# Patient Record
Sex: Female | Born: 1965 | Race: White | Hispanic: No | Marital: Married | Smoking: Never smoker
Health system: Southern US, Community
[De-identification: ages and names within clinical notes are randomized; demographics above are authoritative.]

## PROBLEM LIST (undated history)

## (undated) DIAGNOSIS — R001 Bradycardia, unspecified: Secondary | ICD-10-CM

## (undated) DIAGNOSIS — Z9889 Other specified postprocedural states: Secondary | ICD-10-CM

## (undated) DIAGNOSIS — T8859XA Other complications of anesthesia, initial encounter: Secondary | ICD-10-CM

## (undated) DIAGNOSIS — C801 Malignant (primary) neoplasm, unspecified: Secondary | ICD-10-CM

## (undated) DIAGNOSIS — R079 Chest pain, unspecified: Secondary | ICD-10-CM

## (undated) DIAGNOSIS — K219 Gastro-esophageal reflux disease without esophagitis: Secondary | ICD-10-CM

## (undated) DIAGNOSIS — D649 Anemia, unspecified: Secondary | ICD-10-CM

## (undated) DIAGNOSIS — R112 Nausea with vomiting, unspecified: Secondary | ICD-10-CM

## (undated) HISTORY — PX: EYE SURGERY: SHX253

## (undated) HISTORY — DX: Gastro-esophageal reflux disease without esophagitis: K21.9

## (undated) HISTORY — PX: ABDOMINAL HYSTERECTOMY: SHX81

## (undated) HISTORY — PX: HERNIA REPAIR: SHX51

## (undated) HISTORY — PX: LAPAROSCOPIC GASTRIC SLEEVE RESECTION WITH HIATAL HERNIA REPAIR: SHX6512

---
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
## 2017-07-03 ENCOUNTER — Emergency Department (HOSPITAL_BASED_OUTPATIENT_CLINIC_OR_DEPARTMENT_OTHER)
Admission: EM | Admit: 2017-07-03 | Discharge: 2017-07-03 | Disposition: A | Discharge status: Home / Self Care | Payer: Self-pay | Attending: Emergency Medicine | Admitting: Emergency Medicine

## 2017-07-03 ENCOUNTER — Emergency Department (HOSPITAL_BASED_OUTPATIENT_CLINIC_OR_DEPARTMENT_OTHER): Payer: Self-pay

## 2017-07-03 ENCOUNTER — Encounter (HOSPITAL_BASED_OUTPATIENT_CLINIC_OR_DEPARTMENT_OTHER): Payer: Self-pay | Admitting: Emergency Medicine

## 2017-07-03 DIAGNOSIS — S40012A Contusion of left shoulder, initial encounter: Secondary | ICD-10-CM | POA: Insufficient documentation

## 2017-07-03 DIAGNOSIS — Z8541 Personal history of malignant neoplasm of cervix uteri: Secondary | ICD-10-CM | POA: Insufficient documentation

## 2017-07-03 DIAGNOSIS — Y929 Unspecified place or not applicable: Secondary | ICD-10-CM | POA: Insufficient documentation

## 2017-07-03 DIAGNOSIS — W228XXA Striking against or struck by other objects, initial encounter: Secondary | ICD-10-CM | POA: Insufficient documentation

## 2017-07-03 DIAGNOSIS — Y939 Activity, unspecified: Secondary | ICD-10-CM | POA: Insufficient documentation

## 2017-07-03 DIAGNOSIS — Y998 Other external cause status: Secondary | ICD-10-CM | POA: Insufficient documentation

## 2017-07-03 HISTORY — DX: Malignant (primary) neoplasm, unspecified: C80.1

## 2017-07-03 NOTE — ED Provider Notes (Signed)
[facility] DEPT [facility] Provider Note   CSN: [id number] Arrival date & time: 07/03/17  1445     History   Chief Complaint Chief Complaint  Patient presents with  . Shoulder Pain    HPI Rita Miranda is a 51 y.o. female.  51yo F who p/w L shoulder pain. Yesterday while at work she got struck in the left shoulder by a shelf. She did not hit her head or lose consciousness. She has had constant, moderate to severe left shoulder pain including behind her left shoulder. Pain is worse with trying to raise her arm above 90. She reports normal movement at elbow and hand. She denies any numbness but sometimes the pain shoots down to her elbow. She has bruises on both arms from her work but either not related to yesterday's injury.   The history is provided by the patient.  Shoulder Pain      Past Medical History:  Diagnosis Date  . 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   Past Surgical History:  Procedure Laterality Date  . ABDOMINAL HYSTERECTOMY    . LAPAROSCOPIC GASTRIC SLEEVE RESECTION WITH HIATAL HERNIA REPAIR      OB History    No data available       Home Medications    Prior to Admission medications   Not on File    Family History History reviewed. No pertinent family history.  Social History Social History  Substance Use Topics  . Smoking status: Never Smoker  . Smokeless tobacco: Never Used  . Alcohol use No     Allergies   Patient has no known allergies.   Review of Systems Review of Systems All other systems reviewed and are negative except that which was mentioned in HPI   Physical Exam Updated Vital Signs BP 116/68 (BP Location: Left Arm)   Pulse 60   Temp 97.9 F (36.6 C) (Oral)   Resp 16   Ht 5\' 6"  (1.676 m)   Wt 77.1 kg (170 lb)   SpO2 100%   BMI 27.44 kg/m   Physical Exam  Constitutional: She is oriented to person, place, and time. She appears well-developed and  well-nourished. No distress.  HENT:  Head: Normocephalic and atraumatic.  Eyes: Conjunctivae are normal.  Neck: Neck supple.  Cardiovascular: Intact distal pulses.   Musculoskeletal: She exhibits tenderness.  Tenderness of L anterior shoulder and along lateral side of scapula, no clavicle or shoulder deformity; normal ROM at wrist and elbow, normal grip strength, pain w/ raising shoulder to 90 degrees  Neurological: She is alert and oriented to person, place, and time. No sensory deficit.  Skin: Skin is warm and dry.  Scattered ecchymoses of varying ages on b/l volar forearms; mild ecchymosis top of L shoulder  Psychiatric: She has a normal mood and affect. Judgment normal.  Nursing note and vitals reviewed.    ED Treatments / Results  Labs (all labs ordered are listed, but only abnormal results are displayed) Labs Reviewed - No data to display  EKG  EKG Interpretation None       Radiology Dg Shoulder Left  Result Date: 07/03/2017 CLINICAL DATA:  Work injury.  Pain. EXAM: LEFT SHOULDER - 2+ VIEW COMPARISON:  None. FINDINGS: No acute bony or joint abnormality identified. No evidence of fracture. No evidence of dislocation. IMPRESSION: No acute abnormality. Electronically Signed   By: Marcello Moores  Register   On: 07/03/2017 15:26  Procedures Procedures (including critical care time)  Medications Ordered in ED Medications - No data to display   Initial Impression / Assessment and Plan / ED Course  I have reviewed the triage vital signs and the nursing notes.  Pertinent imaging results that were available during my care of the patient were reviewed by me and considered in my medical decision making (see chart for details).     Struck in L shoulder yesterday, neurovascularly intact on exam without obvious deformity. XR show no acute fx. Discussed supportive measures and f/u if sx continue beyond 1 week. Pt voiced understanding of plan.  Final Clinical Impressions(s) / ED  Diagnoses   Final diagnoses:  Contusion of left shoulder, initial encounter    New Prescriptions There are no discharge medications for this patient.    Heli Dino, Wenda Overland, MD 07/03/17 [phone]

## 2017-07-03 NOTE — ED Triage Notes (Signed)
Patient states that she was lifting a box yesterday and it fell onto her left shoulder. THe patient reports that she is having some tingling and the pain radiates to her left elbow. Noted multiple brusiing to both arms

## 2017-07-19 ENCOUNTER — Ambulatory Visit: Payer: Self-pay | Admitting: Family Medicine

## 2017-10-08 ENCOUNTER — Ambulatory Visit: Payer: Self-pay | Admitting: Family Medicine

## 2017-10-08 ENCOUNTER — Ambulatory Visit: Payer: Self-pay | Admitting: Medical

## 2017-10-08 ENCOUNTER — Ambulatory Visit: Payer: Medicaid Other | Admitting: Medical

## 2018-06-11 ENCOUNTER — Encounter (HOSPITAL_COMMUNITY): Payer: Self-pay | Admitting: Emergency Medicine

## 2018-06-11 ENCOUNTER — Emergency Department (HOSPITAL_COMMUNITY): Payer: Medicaid Other

## 2018-06-11 ENCOUNTER — Emergency Department (HOSPITAL_COMMUNITY)
Admission: EM | Admit: 2018-06-11 | Discharge: 2018-06-11 | Disposition: A | Discharge status: Home / Self Care | Payer: Medicaid Other | Attending: Emergency Medicine | Admitting: Emergency Medicine

## 2018-06-11 DIAGNOSIS — R001 Bradycardia, unspecified: Secondary | ICD-10-CM

## 2018-06-11 DIAGNOSIS — R072 Precordial pain: Secondary | ICD-10-CM | POA: Insufficient documentation

## 2018-06-11 DIAGNOSIS — R5383 Other fatigue: Secondary | ICD-10-CM | POA: Insufficient documentation

## 2018-06-11 LAB — BASIC METABOLIC PANEL
Anion gap: 10 (ref 5–15)
BUN: 10 mg/dL (ref 6–20)
CO2: 27 mmol/L (ref 22–32)
Calcium: 9.4 mg/dL (ref 8.9–10.3)
Chloride: 103 mmol/L (ref 98–111)
Creatinine, Ser: 0.73 mg/dL (ref 0.44–1.00)
GFR calc non Af Amer: >60 mL/min (ref >60)
GLUCOSE: 84 mg/dL (ref 70–99)
Potassium: 3.8 mmol/L (ref 3.5–5.1)
SODIUM: 140 mmol/L (ref 135–145)

## 2018-06-11 LAB — HEPATIC FUNCTION PANEL
ALBUMIN: 3.7 g/dL (ref 3.5–5.0)
ALT: 11 U/L (ref 0–44)
AST: 18 U/L (ref 15–41)
Alkaline Phosphatase: 54 U/L (ref 38–126)
Bilirubin, Direct: 0.1 mg/dL (ref 0.0–0.2)
Indirect Bilirubin: 0.4 mg/dL (ref 0.3–0.9)
TOTAL PROTEIN: 6.5 g/dL (ref 6.5–8.1)
Total Bilirubin: 0.5 mg/dL (ref 0.3–1.2)

## 2018-06-11 LAB — CBC
HCT: 40.4 % (ref 36.0–46.0)
HEMOGLOBIN: 12.9 g/dL (ref 12.0–15.0)
MCH: 31.7 pg (ref 26.0–34.0)
MCHC: 31.9 g/dL (ref 30.0–36.0)
MCV: 99.3 fL (ref 78.0–100.0)
Platelets: 200 10*3/uL (ref 150–400)
RBC: 4.07 MIL/uL (ref 3.87–5.11)
RDW: 12.8 % (ref 11.5–15.5)
WBC: 5 10*3/uL (ref 4.0–10.5)

## 2018-06-11 LAB — I-STAT BETA HCG BLOOD, ED (MC, WL, AP ONLY): I-stat hCG, quantitative: <5 m[IU]/mL (ref <5)

## 2018-06-11 LAB — PHOSPHORUS: PHOSPHORUS: 2.9 mg/dL (ref 2.5–4.6)

## 2018-06-11 LAB — I-STAT TROPONIN, ED: Troponin i, poc: 0 ng/mL (ref 0.00–0.08)

## 2018-06-11 LAB — MAGNESIUM: MAGNESIUM: 2.1 mg/dL (ref 1.7–2.4)

## 2018-06-11 LAB — TSH: TSH: 1.811 u[IU]/mL (ref 0.350–4.500)

## 2018-06-11 MED ORDER — OMEPRAZOLE 20 MG PO CPDR: 20.0000 mg | DELAYED_RELEASE_CAPSULE | Freq: Every day | ORAL | 1 refills | Status: DC

## 2018-06-11 MED ORDER — FAMOTIDINE 20 MG PO TABS
40.0000 mg | ORAL_TABLET | Freq: Once | ORAL | Status: AC
  Administered 2018-06-11: 40 mg via ORAL
  Filled 2018-06-11: qty 2

## 2018-06-11 MED ORDER — ASPIRIN 81 MG PO CHEW
324.0000 mg | CHEWABLE_TABLET | Freq: Once | ORAL | Status: AC
  Administered 2018-06-11: 324 mg via ORAL
  Filled 2018-06-11: qty 4

## 2018-06-11 NOTE — ED Notes (Addendum)
Pt refusing additional blood draw; this RN explained the importance of re-checking troponin; pt still refusing; MD notified and is at bedside

## 2018-06-11 NOTE — ED Notes (Signed)
Pt ambulated in hall; HR fluctuated between 60 and 67; tolerated well

## 2018-06-11 NOTE — ED Triage Notes (Signed)
Pt arrives to ED with c/o of chest tightness for the last 2 months mostly after eating. Pt states she has a history of a gastric shelve. Pt has lost 190lb in the last 2 years. Pt states at this time she has mild tightness in the center of her chest.

## 2018-06-11 NOTE — ED Notes (Signed)
Pt refused IV; MD aware

## 2018-06-11 NOTE — Discharge Instructions (Addendum)
1.  Start Prilosec daily. 2.  Return to the emergency department if you develop lightheadedness, feel like you might pass out, increasing or worsening chest pain or shortness of breath or other concerning symptoms. 3.  You are advised to follow-up with Dr. Terrence Dupont for further evaluation.  You can be seen in the office within the next 1 to 2 days.

## 2018-06-11 NOTE — ED Provider Notes (Signed)
[facility] EMERGENCY DEPARTMENT Provider Note   CSN: [id number] Arrival date & time: 06/11/18  7902     History   Chief Complaint Chief Complaint  Patient presents with  . Chest Pain    HPI Rita Miranda is a 52 y.o. female.  HPI Patient has been getting chest tightness that goes around her lower ribs bilaterally as well as a tight sensation on her left mid chest.  This comes and goes.  It may last anywhere from 5 to 20 minutes.  It is not associated with exertion.  It may occur anytime at rest.  Symptoms have been occurring intermittently for a couple of months.  Patient takes no medications.  Medical history significant for gastric sleeve over 3 years ago.  Patient reported prior to the gastric sleeve she did have reflux problems.  She was very compliant with the recommendations after her surgery and it was not having any problems with reflux.  She has had a few sporadic episodes of vomiting over the past couple weeks.  No blood in emesis.  Patient reports she has had some continued weight loss over the past couple weeks estimated 5 pounds.  She has lost a total of 190 pounds in 2 years.  Patient reports that over the past number of weeks to months she is gotten extremely fatigued.  She reports even with minor activity she feels fatigued and winded. Past Medical History:  Diagnosis Date  . 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   Past Surgical History:  Procedure Laterality Date  . ABDOMINAL HYSTERECTOMY    . LAPAROSCOPIC GASTRIC SLEEVE RESECTION WITH HIATAL HERNIA REPAIR       OB History   None      Home Medications    Prior to Admission medications   Not on File    Family History No family history on file.  Social History Social History   Tobacco Use  . Smoking status: Never Smoker  . Smokeless tobacco: Never Used  Substance Use Topics  . Alcohol use: No  . Drug use: No     Allergies     Patient has no known allergies.   Review of Systems Review of Systems 10 Systems reviewed and are negative for acute change except as noted in the HPI.   Physical Exam Updated Vital Signs BP 106/73   Pulse (!) 59   Temp 98 F (36.7 C) (Oral)   Resp 11   Ht 5\' 7"  (1.702 m)   Wt 63.5 kg   SpO2 100%   BMI 21.93 kg/m   Physical Exam  Constitutional: She is oriented to person, place, and time. She appears well-developed and well-nourished. No distress.  HENT:  Head: Normocephalic and atraumatic.  Mouth/Throat: Oropharynx is clear and moist.  Eyes: Pupils are equal, round, and reactive to light. EOM are normal.  Neck: Neck supple.  Cardiovascular: Normal rate, regular rhythm, normal heart sounds and intact distal pulses.  Pulmonary/Chest: Effort normal and breath sounds normal.  Abdominal: Soft. Bowel sounds are normal. She exhibits no distension and no mass. There is no tenderness. There is no guarding.  Musculoskeletal: Normal range of motion. She exhibits no edema or tenderness.  Neurological: She is alert and oriented to person, place, and time. She has normal strength. No cranial nerve deficit. She exhibits normal muscle tone. Coordination normal. GCS eye subscore is 4. GCS verbal subscore is 5. GCS motor  subscore is 6.  Skin: Skin is warm, dry and intact.  Psychiatric: She has a normal mood and affect.     ED Treatments / Results  Labs (all labs ordered are listed, but only abnormal results are displayed) Labs Reviewed  BASIC METABOLIC PANEL  CBC  TSH  MAGNESIUM  PHOSPHORUS  HEPATIC FUNCTION PANEL  I-STAT TROPONIN, ED  I-STAT BETA HCG BLOOD, ED ([facility], [facility], [facility] ONLY)    EKG EKG Interpretation  Date/Time:  Tuesday June 11 2018 07:20:08 EDT Ventricular Rate:  58 PR Interval:  174 QRS Duration: 86 QT Interval:  410 QTC Calculation: 402 R Axis:   79 Text Interpretation:  Sinus bradycardia with sinus arrhythmia no ischemic appearance. otherwise normal  Confirmed by Charlesetta Shanks [phone]) on 06/11/2018 8:50:31 AM   Radiology Dg Chest 2 View  Result Date: 06/11/2018 CLINICAL DATA:  Chest pain and shortness of breath EXAM: CHEST - 2 VIEW COMPARISON:  None. FINDINGS: There is no edema or consolidation. The heart size and pulmonary vascularity are normal. No adenopathy. No pneumothorax. No bone lesions. IMPRESSION: No edema or consolidation. Electronically Signed   By: Lowella Grip III M.D.   On: 06/11/2018 08:07    Procedures Procedures (including critical care time)  Medications Ordered in ED Medications - No data to display   Initial Impression / Assessment and Plan / ED Course  I have reviewed the triage vital signs and the nursing notes.  Pertinent labs & imaging results that were available during my care of the patient were reviewed by me and considered in my medical decision making (see chart for details).    Patient is alert without signs of hypoperfusion or respiratory distress.  Her heart rate at rest does drop down to 40 bpm.  Patient advised that she has been getting chest pain with a quality of both tightness on both sides of her ribs in conjunction with a left anterior pressure feeling.  Cardiology consulted for bradycardia with chest discomfort.  Patient is a non-smoker without history of hypertension.  She reports walking approximately 3 miles daily for exercise.  EKG does not show acute ischemic pattern and first troponin negative.  Patient does have history of gastric sleeve.  It does not appear that she is having immediate complications.  Patient's abdominal exam is nontender and nonsurgical.  She is not having recurrent vomiting to suggest obstructive pathology.  There have been occasional episodes intermittently.   Consult: Reviewed with Dr. Terrence Dupont.  Patient has low risk for cardiac ischemic disease.  We did review her exercise pattern and Dr. Terrence Dupont thought her bradycardia may well be consistent with physical  conditioning and not pathologic etiology.  Patient is not symptomatic with it.  Cardiac enzymes negative EKG without ischemic changes.  Dr. Terrence Dupont advises patient can follow-up in the office within 1 to 2 days.,  If any concerning changes patient could be admitted.  Patient advises that she does not have insurance due to a here with her employer.  She has been employed and should have been on the plan but now has been told she has to re-enroll and will not be covered until January due to reenrollment being in November.  Patient advised that she has tried to correct the situation but to no avail.  She advises that she will not be able to do a follow-up until she is covered by insurance.  Patient counseled that although this is obviously financially difficult, she must not hesitate to return to the emergency department  and/or schedule her follow-up with Dr. Terrence Dupont should her symptoms worsen, change or new symptoms develop.  She voices understanding. Final Clinical Impressions(s) / ED Diagnoses   Final diagnoses:  Bradycardia  Precordial pain  Fatigue, unspecified type    ED Discharge Orders    None       Charlesetta Shanks, MD 06/11/18 1223

## 2018-06-11 NOTE — ED Notes (Signed)
Pt endorses syncopal episode 2 weeks ago

## 2018-06-11 NOTE — ED Notes (Signed)
Patient verbalizes understanding of discharge instructions. Opportunity for questioning and answers were provided. Armband removed by staff, pt discharged from ED ambulatory. Pt refused wheelchair.

## 2018-11-11 ENCOUNTER — Emergency Department (HOSPITAL_COMMUNITY): Payer: Self-pay

## 2018-11-11 ENCOUNTER — Observation Stay (HOSPITAL_COMMUNITY)
Admission: EM | Admit: 2018-11-11 | Discharge: 2018-11-13 | Disposition: A | Discharge status: Home / Self Care | Payer: Self-pay | Attending: Family Medicine | Admitting: Family Medicine

## 2018-11-11 ENCOUNTER — Other Ambulatory Visit: Payer: Self-pay

## 2018-11-11 ENCOUNTER — Encounter (HOSPITAL_COMMUNITY): Payer: Self-pay

## 2018-11-11 DIAGNOSIS — R079 Chest pain, unspecified: Secondary | ICD-10-CM

## 2018-11-11 DIAGNOSIS — K219 Gastro-esophageal reflux disease without esophagitis: Secondary | ICD-10-CM | POA: Insufficient documentation

## 2018-11-11 DIAGNOSIS — R0789 Other chest pain: Secondary | ICD-10-CM

## 2018-11-11 DIAGNOSIS — Z9884 Bariatric surgery status: Secondary | ICD-10-CM | POA: Insufficient documentation

## 2018-11-11 DIAGNOSIS — Z8249 Family history of ischemic heart disease and other diseases of the circulatory system: Secondary | ICD-10-CM | POA: Insufficient documentation

## 2018-11-11 DIAGNOSIS — Z79899 Other long term (current) drug therapy: Secondary | ICD-10-CM | POA: Insufficient documentation

## 2018-11-11 DIAGNOSIS — Z8542 Personal history of malignant neoplasm of other parts of uterus: Secondary | ICD-10-CM | POA: Insufficient documentation

## 2018-11-11 HISTORY — DX: Chest pain, unspecified: R07.9

## 2018-11-11 LAB — CBC
HCT: 41.1 % (ref 36.0–46.0)
Hemoglobin: 12.7 g/dL (ref 12.0–15.0)
MCH: 31.6 pg (ref 26.0–34.0)
MCHC: 30.9 g/dL (ref 30.0–36.0)
MCV: 102.2 fL — ABNORMAL HIGH (ref 80.0–100.0)
NRBC: 0.7 % — AB (ref 0.0–0.2)
Platelets: 151 10*3/uL (ref 150–400)
RBC: 4.02 MIL/uL (ref 3.87–5.11)
RDW: 12.6 % (ref 11.5–15.5)
WBC: 5.5 10*3/uL (ref 4.0–10.5)

## 2018-11-11 LAB — BASIC METABOLIC PANEL
Anion gap: 7 (ref 5–15)
BUN: 15 mg/dL (ref 6–20)
CO2: 25 mmol/L (ref 22–32)
CREATININE: 0.81 mg/dL (ref 0.44–1.00)
Calcium: 8.8 mg/dL — ABNORMAL LOW (ref 8.9–10.3)
Chloride: 107 mmol/L (ref 98–111)
GFR calc Af Amer: >60 mL/min (ref >60)
GFR calc non Af Amer: >60 mL/min (ref >60)
Glucose, Bld: 88 mg/dL (ref 70–99)
Potassium: 4.6 mmol/L (ref 3.5–5.1)
SODIUM: 139 mmol/L (ref 135–145)

## 2018-11-11 LAB — I-STAT TROPONIN, ED: Troponin i, poc: 0 ng/mL (ref 0.00–0.08)

## 2018-11-11 LAB — I-STAT BETA HCG BLOOD, ED (MC, WL, AP ONLY)

## 2018-11-11 LAB — TROPONIN I: Troponin I: <0.03 ng/mL (ref <0.03)

## 2018-11-11 MED ORDER — SODIUM CHLORIDE 0.9% FLUSH: 3.0000 mL | Freq: Once | INTRAVENOUS | Status: DC

## 2018-11-11 MED ORDER — ONDANSETRON HCL 4 MG/2ML IJ SOLN
4.0000 mg | Freq: Four times a day (QID) | INTRAMUSCULAR | Status: DC | PRN
  Administered 2018-11-13: 4 mg via INTRAVENOUS
  Filled 2018-11-11: qty 2

## 2018-11-11 MED ORDER — ENOXAPARIN SODIUM 40 MG/0.4ML ~~LOC~~ SOLN: 40.0000 mg | SUBCUTANEOUS | Status: DC

## 2018-11-11 MED ORDER — PANTOPRAZOLE SODIUM 40 MG PO TBEC
40.0000 mg | DELAYED_RELEASE_TABLET | Freq: Every day | ORAL | Status: DC
  Administered 2018-11-12 – 2018-11-13 (×2): 40 mg via ORAL
  Filled 2018-11-11 (×2): qty 1

## 2018-11-11 MED ORDER — ACETAMINOPHEN 325 MG PO TABS
650.0000 mg | ORAL_TABLET | ORAL | Status: DC | PRN
  Administered 2018-11-12 – 2018-11-13 (×2): 650 mg via ORAL
  Filled 2018-11-11 (×2): qty 2

## 2018-11-11 NOTE — H&P (Signed)
History and Physical    Rita Miranda HUD:[id number] DOB: 03-18-1966 DOA: 11/11/2018  PCP: Patient, No Pcp Per  Patient coming from: Home  I have personally briefly reviewed patient's old medical records in [place]  Chief Complaint: CP  HPI: Rita Miranda is a 53 y.o. female with medical history significant of gastric sleeve surgery, GERD prior to that.  Patient presents to the ED with c/o CP.  Has been having intermittent CP for months.  Seen in ED in Sept last year, noted to have bradycardia at the time, but felt to be low risk due to excessive exercise.  Today had CP onset at work, persisted until after she got to ED.  Her CP is not nessicarily exertional.  She does seem to note that it occurs more frequently after eating.  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  Does have fairly extensive FHx of heart disease including CAD in multiple family members.   ED Course: Trop neg.  EKG shows sinus bradycardia in the 40s-50s.  Hospitalist asked to admit.   Review of Systems: As per HPI otherwise 10 point review of systems negative.   Past Medical History:  Diagnosis Date  . 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 ABDOMINAL HYSTERECTOMY    . LAPAROSCOPIC GASTRIC SLEEVE RESECTION WITH HIATAL HERNIA REPAIR       reports that she has never smoked. She has never used smokeless tobacco. She reports that she does not drink alcohol or use drugs.  Allergies  Allergen Reactions  . Other Hives    seafood    Family History  Problem Relation Age of Onset  . Heart disease Mother   . Coronary artery disease Brother   Multiple family members with heart disease including CAD, stents, etc.  Prior to Admission medications   Medication Sig Start Date End Date Taking? Authorizing Provider    omeprazole (PRILOSEC) 20 MG capsule Take 1 capsule (20 mg total) by mouth daily. 06/11/18  Yes Charlesetta Shanks, MD    Physical Exam: Vitals:   11/11/18 1343 11/11/18 1345 11/11/18 1839 11/11/18 2030  BP:  104/75 128/85 108/71  Pulse:  76 (!) 55 (!) 58  Resp:  18 17 14   Temp:  97.8 F (36.6 C)    TempSrc:  Oral    SpO2:  100% 99% 97%  Weight: 68 kg     Height: 5\' 6"  (1.676 m)       Constitutional: NAD, calm, comfortable Eyes: PERRL, lids and conjunctivae normal ENMT: Mucous membranes are moist. Posterior pharynx clear of any exudate or lesions.Normal dentition.  Neck: normal, supple, no masses, no thyromegaly Respiratory: clear to auscultation bilaterally, no wheezing, no crackles. Normal respiratory effort. No accessory muscle use.  Cardiovascular: Regular rate and rhythm, no murmurs / rubs / gallops. No extremity edema. 2+ pedal pulses. No carotid bruits.  Abdomen: no tenderness, no masses palpated. No hepatosplenomegaly. Bowel sounds positive.  Musculoskeletal: no clubbing / cyanosis. No joint deformity upper and lower extremities. Good ROM, no contractures. Normal muscle tone.  Skin: no rashes, lesions, ulcers. No induration Neurologic: CN 2-12 grossly intact. Sensation intact, DTR normal. Strength 5/5 in all 4.  Psychiatric: Normal judgment and insight. Alert and oriented x 3. Normal mood.  Labs on Admission: I have personally reviewed following labs and imaging studies  CBC: Recent Labs  Lab 11/11/18 1358  WBC 5.5  HGB 12.7  HCT 41.1  MCV 102.2*  PLT 852   Basic Metabolic Panel: Recent Labs  Lab 11/11/18 1358  NA 139  K 4.6  CL 107  CO2 25  GLUCOSE 88  BUN 15  CREATININE 0.81  CALCIUM 8.8*   GFR: Estimated Creatinine Clearance: 76.9 mL/min (by C-G formula based on SCr of 0.81 mg/dL). Liver Function Tests: No results for input(s): AST, ALT, ALKPHOS, BILITOT, PROT, ALBUMIN in the last 168 hours. No results for input(s): LIPASE, AMYLASE in the last 168  hours. No results for input(s): AMMONIA in the last 168 hours. Coagulation Profile: No results for input(s): INR, PROTIME in the last 168 hours. Cardiac Enzymes: No results for input(s): CKTOTAL, CKMB, CKMBINDEX, TROPONINI in the last 168 hours. BNP (last 3 results) No results for input(s): PROBNP in the last 8760 hours. HbA1C: No results for input(s): HGBA1C in the last 72 hours. CBG: No results for input(s): GLUCAP in the last 168 hours. Lipid Profile: No results for input(s): CHOL, HDL, LDLCALC, TRIG, CHOLHDL, LDLDIRECT in the last 72 hours. Thyroid Function Tests: No results for input(s): TSH, T4TOTAL, FREET4, T3FREE, THYROIDAB in the last 72 hours. Anemia Panel: No results for input(s): VITAMINB12, FOLATE, FERRITIN, TIBC, IRON, RETICCTPCT in the last 72 hours. Urine analysis: No results found for: COLORURINE, APPEARANCEUR, LABSPEC, PHURINE, GLUCOSEU, HGBUR, BILIRUBINUR, KETONESUR, PROTEINUR, UROBILINOGEN, NITRITE, LEUKOCYTESUR  Radiological Exams on Admission: Dg Chest 2 View  Result Date: 11/11/2018 CLINICAL DATA:  Chest pain and weakness. EXAM: CHEST - 2 VIEW COMPARISON:  06/11/2018 FINDINGS: The cardiac silhouette, mediastinal and hilar contours are within normal limits and stable. The lungs are clear. No infiltrates, edema or effusions. No worrisome pulmonary lesions. The bony thorax is intact. IMPRESSION: No acute cardiopulmonary findings. Electronically Signed   By: Marijo Sanes M.D.   On: 11/11/2018 14:36    EKG: Independently reviewed.  Assessment/Plan Active Problems:   Chest pain, rule out acute myocardial infarction    1. CP r/o - 1. CP obs pathway 2. Serial trops 3. Tele monitor 4. NPO after MN 5. Call cards for eval in AM  DVT prophylaxis: Lovenox Code Status: Full Family Communication: No family in room  Disposition Plan: Home after admit Consults called: None Admission status: Place in 43   [place], [place] Hospitalists  How to  contact the [facility] Attending or Consulting provider [place] or covering provider during after hours [place], for this patient?  1. Check the care team in [facility] and look for a) attending/consulting [facility] provider listed and b) the [facility] team listed 2. Log into [email or web address]  Amion Physician Scheduling and messaging for groups and whole hospitals  On call and physician scheduling software for group practices, residents, hospitalists and other medical providers for call, clinic, rotation and shift schedules. OnCall [employer] is a hospital-wide system for scheduling doctors and paging doctors on call. EasyPlot is for scientific plotting and data analysis.  [email or web address]  and use [place]'s universal password to access. If you do not have the password, please contact the hospital operator.  3. Locate the [facility] provider you are looking for under [facility] and page to a number that you can be directly reached. 4. If you still have difficulty reaching the provider, please page the [facility] (Director on Call) for the Hospitalists listed on amion for assistance.  11/11/2018, 9:42 PM

## 2018-11-11 NOTE — ED Notes (Addendum)
When attempting to place IV before the PT was transported up to floor pt refused IV stating that "I do not want IV". Pt was informed the reason for IV, pt still stated that she would not like an IV.

## 2018-11-11 NOTE — ED Provider Notes (Signed)
*- [facility] DEPT Provider Note   CSN: [id number] Arrival date & time: 11/11/18  1337    History   Chief Complaint Chief Complaint  Patient presents with  . Chest Pain  . Headache    HPI Rita Miranda is a 53 y.o. female.     53 year old female who presents with chest pain times several day described as lasting for minutes to hours.  Discomfort starts the middle of her chest and then radiates to her back.  States that at times it can be exertional and made better with rest.  No recent fever cough or congestion.  No leg pain or swelling.  Has been told the past that she has bradycardia but has not received treatment for this.  Patient notes that she has been feeling weak and feels as if she might pass out at times.     Past Medical History:  Diagnosis Date  . Cancer [facility])    uterine    There are no active problems to display for this patient.   Past Surgical History:  Procedure Laterality Date  . ABDOMINAL HYSTERECTOMY    . LAPAROSCOPIC GASTRIC SLEEVE RESECTION WITH HIATAL HERNIA REPAIR       OB History   No obstetric history on file.      Home Medications    Prior to Admission medications   Medication Sig Start Date End Date Taking? Authorizing Provider  omeprazole (PRILOSEC) 20 MG capsule Take 1 capsule (20 mg total) by mouth daily. 06/11/18   Charlesetta Shanks, MD    Family History History reviewed. No pertinent family history.  Social History Social History   Tobacco Use  . Smoking status: Never Smoker  . Smokeless tobacco: Never Used  Substance Use Topics  . Alcohol use: No  . Drug use: No     Allergies   Patient has no known allergies.   Review of Systems Review of Systems  All other systems reviewed and are negative.    Physical Exam Updated Vital Signs BP 128/85 (BP Location: Right Arm)   Pulse (!) 55   Temp 97.8 F (36.6 C) (Oral)   Resp 17   Ht 1.676 m (5\' 6" )   Wt 68 kg   SpO2 99%   BMI  24.21 kg/m   Physical Exam Vitals signs and nursing note reviewed.  Constitutional:      General: She is not in acute distress.    Appearance: Normal appearance. She is well-developed. She is not toxic-appearing.  HENT:     Head: Normocephalic and atraumatic.  Eyes:     General: Lids are normal.     Conjunctiva/sclera: Conjunctivae normal.     Pupils: Pupils are equal, round, and reactive to light.  Neck:     Musculoskeletal: Normal range of motion and neck supple.     Thyroid: No thyroid mass.     Trachea: No tracheal deviation.  Cardiovascular:     Rate and Rhythm: Regular rhythm. Bradycardia present.     Heart sounds: Normal heart sounds. No murmur. No gallop.   Pulmonary:     Effort: Pulmonary effort is normal. No respiratory distress.     Breath sounds: Normal breath sounds. No stridor. No decreased breath sounds, wheezing, rhonchi or rales.  Abdominal:     General: Bowel sounds are normal. There is no distension.     Palpations: Abdomen is soft.     Tenderness: There is no abdominal tenderness. There is no rebound.  Musculoskeletal: Normal  range of motion.        General: No tenderness.  Skin:    General: Skin is warm and dry.     Findings: No abrasion or rash.  Neurological:     Mental Status: She is alert and oriented to person, place, and time.     GCS: GCS eye subscore is 4. GCS verbal subscore is 5. GCS motor subscore is 6.     Cranial Nerves: No cranial nerve deficit.     Sensory: No sensory deficit.  Psychiatric:        Speech: Speech normal.        Behavior: Behavior normal.      ED Treatments / Results  Labs (all labs ordered are listed, but only abnormal results are displayed) Labs Reviewed  BASIC METABOLIC PANEL - Abnormal; Notable for the following components:      Result Value   Calcium 8.8 (*)    All other components within normal limits  CBC - Abnormal; Notable for the following components:   MCV 102.2 (*)    nRBC 0.7 (*)    All other  components within normal limits  I-STAT TROPONIN, ED  I-STAT BETA HCG BLOOD, ED ([facility], [facility], [facility] ONLY)    EKG None   ED ECG REPORT   Date: 11/11/2018  Rate: 46  Rhythm: sinus bradycardia  QRS Axis: normal  Intervals: normal  ST/T Wave abnormalities: normal  Conduction Disutrbances:none  Narrative Interpretation:   Old EKG Reviewed: none available  I have personally reviewed the EKG tracing and agree with the computerized printout as noted.   Radiology Dg Chest 2 View  Result Date: 11/11/2018 CLINICAL DATA:  Chest pain and weakness. EXAM: CHEST - 2 VIEW COMPARISON:  06/11/2018 FINDINGS: The cardiac silhouette, mediastinal and hilar contours are within normal limits and stable. The lungs are clear. No infiltrates, edema or effusions. No worrisome pulmonary lesions. The bony thorax is intact. IMPRESSION: No acute cardiopulmonary findings. Electronically Signed   By: Marijo Sanes M.D.   On: 11/11/2018 14:36    Procedures Procedures (including critical care time)  Medications Ordered in ED Medications  sodium chloride flush (NS) 0.9 % injection 3 mL (has no administration in time range)     Initial Impression / Assessment and Plan / ED Course  I have reviewed the triage vital signs and the nursing notes.  Pertinent labs & imaging results that were available during my care of the patient were reviewed by me and considered in my medical decision making (see chart for details).       Patient here complaining of chest discomfort with periods of bradycardia here.  EKG shows heart rate in the 40s.  Due to her symptoms of chest pain as well as bradycardia will consult hospitalist for admission   Final Clinical Impressions(s) / ED Diagnoses   Final diagnoses:  None    ED Discharge Orders    None       Lacretia Leigh, MD 11/11/18 2012

## 2018-11-11 NOTE — ED Triage Notes (Signed)
Pt presents with c/o chest pain, headache, and dizziness for several days. Pt reports that she has had some episodes of vomiting with eating, hx of gastric sleeve.

## 2018-11-12 LAB — TROPONIN I: Troponin I: <0.03 ng/mL (ref <0.03)

## 2018-11-12 MED ORDER — NITROGLYCERIN 0.4 MG SL SUBL: 0.4000 mg | SUBLINGUAL_TABLET | SUBLINGUAL | Status: DC | PRN

## 2018-11-12 MED ORDER — ALUM & MAG HYDROXIDE-SIMETH 200-200-20 MG/5ML PO SUSP
30.0000 mL | ORAL | Status: DC | PRN
  Filled 2018-11-12: qty 30

## 2018-11-12 MED ORDER — ASPIRIN EC 81 MG PO TBEC
81.0000 mg | DELAYED_RELEASE_TABLET | Freq: Every day | ORAL | Status: DC
  Administered 2018-11-12 – 2018-11-13 (×2): 81 mg via ORAL
  Filled 2018-11-12 (×2): qty 1

## 2018-11-12 NOTE — Progress Notes (Signed)
Patient at this time wanting to wait on IV access until tomorrow morning. The procedure for Nuc Med is tomorrow and the patient is concerned that the IV may not work tomorrow if placed today. Spoke with RN Claiborne Billings and made her aware of the situation. IV team consult will be replaced for PIV in the morning

## 2018-11-12 NOTE — Consult Note (Signed)
Reason for Consult: Recurrent chest pain Referring Physician: [facility] hospitalist  Rita Miranda is an 53 y.o. female.  HPI: Patient is 53 year old female with no significant past medical history except for morbid obesity status post gastric sleeve surgery in the past, family history of coronary artery disease, history of sinus bradycardia asymptomatic, was admitted yesterday because of recurrent retrosternal chest pain off and on lasting few minutes to hours for last 2 to 3 weeks yesterday pain got worse so decided to come to ED patient also complains of feeling tired fatigued and no energy lately.  Denies any exertional chest pain states marks 3 miles daily without any problems.  Denies any cardiac work-up in the past EKG done in the ED showed normal sinus rhythm with no acute ischemic changes 3 sets of troponin I are negative.  Past Medical History:  Diagnosis Date  . 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 ABDOMINAL HYSTERECTOMY    . LAPAROSCOPIC GASTRIC SLEEVE RESECTION WITH HIATAL HERNIA REPAIR      Family History  Problem Relation Age of Onset  . Heart disease Mother   . Coronary artery disease Brother     Social History:  reports that she has never smoked. She has never used smokeless tobacco. She reports that she does not drink alcohol or use drugs.  Allergies:  Allergies  Allergen Reactions  . Other Hives    seafood    Medications: I have reviewed the patient's current medications.  Results for orders placed or performed during the hospital encounter of 11/11/18 (from the past 48 hour(s))  Basic metabolic panel     Status: Abnormal   Collection Time: 11/11/18  1:58 PM  Result Value Ref Range   Sodium 139 135 - 145 mmol/L   Potassium 4.6 3.5 - 5.1 mmol/L   Chloride 107 98 - 111 mmol/L   CO2 25 22 - 32 mmol/L   Glucose, Bld 88 70 - 99 mg/dL   BUN 15 6 - 20 mg/dL   Creatinine, Ser 0.81 0.44 - 1.00 mg/dL   Calcium 8.8 (L) 8.9  - 10.3 mg/dL   GFR calc non Af Amer >60 >60 mL/min   GFR calc Af Amer >60 >60 mL/min   Anion gap 7 5 - 15    Comment: Performed at [facility], [place] [address] [place], [place] [address]  CBC     Status: Abnormal   Collection Time: 11/11/18  1:58 PM  Result Value Ref Range   WBC 5.5 4.0 - 10.5 K/uL   RBC 4.02 3.87 - 5.11 MIL/uL   Hemoglobin 12.7 12.0 - 15.0 g/dL   HCT 41.1 36.0 - 46.0 %   MCV 102.2 (H) 80.0 - 100.0 fL   MCH 31.6 26.0 - 34.0 pg   MCHC 30.9 30.0 - 36.0 g/dL   RDW 12.6 11.5 - 15.5 %   Platelets 151 150 - 400 K/uL   nRBC 0.7 (H) 0.0 - 0.2 %    Comment: Performed at [facility], [place] [address], [place],  [address]  I-Stat beta hCG blood, ED     Status: None   Collection Time: 11/11/18  2:05 PM  Result Value Ref Range   I-stat hCG, quantitative <5.0 <5 mIU/mL   Comment 3            Comment:   GEST. AGE      CONC.  (mIU/mL)   <=1 WEEK  5 - 50     2 WEEKS       50 - 500     3 WEEKS       100 - 10,000     4 WEEKS     1,000 - 30,000        FEMALE AND NON-PREGNANT FEMALE:     LESS THAN 5 mIU/mL   I-stat troponin, ED     Status: None   Collection Time: 11/11/18  2:06 PM  Result Value Ref Range   Troponin i, poc 0.00 0.00 - 0.08 ng/mL   Comment 3            Comment: Due to the release kinetics of cTnI, a negative result within the first hours of the onset of symptoms does not rule out myocardial infarction with certainty. If myocardial infarction is still suspected, repeat the test at appropriate intervals.   Troponin I - Now Then Q3H     Status: None   Collection Time: 11/11/18  9:41 PM  Result Value Ref Range   Troponin I <0.03 <0.03 ng/mL    Comment: Performed at [facility], [place] [address], [place], [place] [address]  Troponin I - Now Then Q3H     Status: None   Collection Time: 11/12/18  2:20 AM  Result Value Ref Range   Troponin I <0.03 <0.03 ng/mL    Comment: Performed at [facility], [place] [address], [place], [place] [address]    Dg Chest 2 View  Result Date: 11/11/2018 CLINICAL DATA:  Chest pain and weakness. EXAM: CHEST - 2 VIEW COMPARISON:  06/11/2018 FINDINGS: The cardiac silhouette, mediastinal and hilar contours are within normal limits and stable. The lungs are clear. No infiltrates, edema or effusions. No worrisome pulmonary lesions. The bony thorax is intact. IMPRESSION: No acute cardiopulmonary findings. Electronically Signed   By: Marijo Sanes M.D.   On: 11/11/2018 14:36    Review of Systems  Constitutional: Positive for malaise/fatigue and weight loss. Negative for chills and fever.  HENT: Negative for hearing loss.   Eyes: Negative for blurred vision.  Respiratory: Negative for hemoptysis, sputum production and shortness of breath.   Cardiovascular: Positive for chest pain. Negative for palpitations, orthopnea, claudication and leg swelling.  Gastrointestinal: Positive for nausea. Negative for abdominal pain.  Genitourinary: Negative for dysuria.  Skin: Negative for rash.  Neurological: Negative for dizziness.   Blood pressure 92/64, pulse (!) 56, temperature 97.6 F (36.4 C), temperature source Oral, resp. rate 14, height 5\' 6"  (1.676 m), weight 68 kg, SpO2 100 %. Physical Exam  Constitutional: She is oriented to person, place, and time.  HENT:  Head: Normocephalic and atraumatic.  Eyes: Pupils are equal, round, and reactive to light. Conjunctivae are normal. Left eye exhibits no discharge. No scleral icterus.  Neck: Normal range of motion. Neck supple. No JVD present. No thyromegaly present.  Cardiovascular: Normal rate and regular rhythm.  Murmur (Soft systolic murmur noted no S3 gallop) heard. Respiratory: Effort normal and breath sounds normal. No respiratory distress. She has no wheezes. She has no rales.  GI: Soft. Bowel sounds are normal. She exhibits no distension. There is no abdominal tenderness. There is no rebound.   Musculoskeletal:        General: No tenderness, deformity or edema.  Neurological: She is alert and oriented to person, place, and time.    Assessment/Plan: Status post chest pain MI ruled out Status post gastric sleeve surgery in the  past GERD Positive family history of coronary artery disease History of sciatica Plan Agree with present management We will schedule for Lexiscan Myoview in a.m. Check lipid panel  Charolette Forward 11/12/2018, 12:17 PM

## 2018-11-12 NOTE — Care Management Note (Signed)
Case Management Note  Patient Details  Name: Rita Miranda MRN: [id number] Date of Birth: [birth year]  Subjective/Objective:  Chest pain. From home w/spouse. Just started working, will get health insurance in a few months.No insurance,or pcp-provided w/pcp listing-encouraged [facility] @ [facility], can go to [facility] pharmacy for meds. Patient concerned about paying the hospital beill-provided billing tel#,informed of payment plan-patient very appreciative.No further CM needs.                  Action/Plan:dc home.   Expected Discharge Date:  (unknown)               Expected Discharge Plan:  Home/Self Care  In-House Referral:     Discharge planning Services  CM Consult, [facility]  Post Acute Care Choice:    Choice offered to:     DME Arranged:    DME Agency:     HH Arranged:    HH Agency:     Status of Service:  Completed, signed off  If discussed at [employer] of Stay Meetings, dates discussed:    Additional Comments:  Dessa Phi, RN 11/12/2018, 1:38 PM

## 2018-11-12 NOTE — Progress Notes (Signed)
Arranged [facility] transportation for tomorrow 2/19 to be at [place] at [facility] at 0800 for a stress test.  [facility] will arrive around 0730 for pickup.

## 2018-11-12 NOTE — Progress Notes (Signed)
PROGRESS NOTE    Rita Miranda  IOE:[id number] DOB: 1965/12/10 DOA: 11/11/2018 PCP: Patient, No Pcp Per   Brief Narrative:  Rita Miranda is a 53 y.o. female with medical history significant of gastric sleeve surgery, GERD presents to ED with chest pain and asymptomatic bradycardia .  Assessment & Plan:   Active Problems:   Chest pain, rule out acute myocardial infarction   Chest pain:  Atypical in nature; Some associated with food .  Serial troponin's negative.  EKG sinus bradycardia.  None today.  Cardiology consulted and plan for stress test ina m.  Npo after midnight.  Meanwhile get echocardiogram and check lipid panel and TSH,  Rule out GERD. On PPI.  If non cardiac, recommend referral to outpatient gastroenterology.     DVT prophylaxis: lovenox.  Code Status: full code.  Family Communication: husband at bedside.  Disposition Plan: pending cardiology work up.   Consultants:   Dr Terrence Dupont.   Procedures:echocardiogram.   Antimicrobials: none.   Subjective: Anxious.   Objective: Vitals:   11/11/18 2215 11/11/18 2243 11/12/18 0427 11/12/18 1255  BP: 110/74 121/81 92/64 98/72   Pulse: (!) 48 (!) 47 (!) 56 64  Resp: 14 14 14 19   Temp:  97.7 F (36.5 C) 97.6 F (36.4 C) (!) 97.5 F (36.4 C)  TempSrc:  Oral Oral Oral  SpO2: 97% 100% 100% 96%  Weight:      Height:        Intake/Output Summary (Last 24 hours) at 11/12/2018 1557 Last data filed at 11/12/2018 1256 Gross per 24 hour  Intake 600 ml  Output 3 ml  Net 597 ml   Filed Weights   11/11/18 1343  Weight: 68 kg    Examination:  General exam: Appears calm and comfortable  Respiratory system: Clear to auscultation. Respiratory effort normal. Cardiovascular system: S1 & S2 heard, RRR. No JVD, Gastrointestinal system: Abdomen is nondistended, soft and nontender. Normal bowel sounds heard. Central nervous system: Alert and oriented. No focal neurological deficits. Extremities: Symmetric 5 x 5  power. Skin: No rashes, lesions or ulcers Psychiatry:  Mood & affect appropriate.     Data Reviewed: I have personally reviewed following labs and imaging studies  CBC: Recent Labs  Lab 11/11/18 1358  WBC 5.5  HGB 12.7  HCT 41.1  MCV 102.2*  PLT 182   Basic Metabolic Panel: Recent Labs  Lab 11/11/18 1358  NA 139  K 4.6  CL 107  CO2 25  GLUCOSE 88  BUN 15  CREATININE 0.81  CALCIUM 8.8*   GFR: Estimated Creatinine Clearance: 76.9 mL/min (by C-G formula based on SCr of 0.81 mg/dL). Liver Function Tests: No results for input(s): AST, ALT, ALKPHOS, BILITOT, PROT, ALBUMIN in the last 168 hours. No results for input(s): LIPASE, AMYLASE in the last 168 hours. No results for input(s): AMMONIA in the last 168 hours. Coagulation Profile: No results for input(s): INR, PROTIME in the last 168 hours. Cardiac Enzymes: Recent Labs  Lab 11/11/18 2141 11/12/18 0220  TROPONINI <0.03 <0.03   BNP (last 3 results) No results for input(s): PROBNP in the last 8760 hours. HbA1C: No results for input(s): HGBA1C in the last 72 hours. CBG: No results for input(s): GLUCAP in the last 168 hours. Lipid Profile: No results for input(s): CHOL, HDL, LDLCALC, TRIG, CHOLHDL, LDLDIRECT in the last 72 hours. Thyroid Function Tests: No results for input(s): TSH, T4TOTAL, FREET4, T3FREE, THYROIDAB in the last 72 hours. Anemia Panel: No results for input(s): VITAMINB12, FOLATE, FERRITIN, TIBC,  IRON, RETICCTPCT in the last 72 hours. Sepsis Labs: No results for input(s): PROCALCITON, LATICACIDVEN in the last 168 hours.  No results found for this or any previous visit (from the past 240 hour(s)).       Radiology Studies: Dg Chest 2 View  Result Date: 11/11/2018 CLINICAL DATA:  Chest pain and weakness. EXAM: CHEST - 2 VIEW COMPARISON:  06/11/2018 FINDINGS: The cardiac silhouette, mediastinal and hilar contours are within normal limits and stable. The lungs are clear. No infiltrates, edema or  effusions. No worrisome pulmonary lesions. The bony thorax is intact. IMPRESSION: No acute cardiopulmonary findings. Electronically Signed   By: Marijo Sanes M.D.   On: 11/11/2018 14:36        Scheduled Meds: . aspirin EC  81 mg Oral Daily  . enoxaparin (LOVENOX) injection  40 mg Subcutaneous Q24H  . pantoprazole  40 mg Oral Daily  . sodium chloride flush  3 mL Intravenous Once   Continuous Infusions:   LOS: 0 days    Time spent: 35 minutes    Hosie Poisson, MD [facility] Pager [phone]  If 7PM-7AM, please contact night-coverage [email or web address] Password [facility] 11/12/2018, 3:57 PM

## 2018-11-13 ENCOUNTER — Observation Stay (HOSPITAL_COMMUNITY): Payer: Self-pay

## 2018-11-13 ENCOUNTER — Observation Stay (HOSPITAL_BASED_OUTPATIENT_CLINIC_OR_DEPARTMENT_OTHER): Payer: Self-pay

## 2018-11-13 ENCOUNTER — Ambulatory Visit (HOSPITAL_COMMUNITY)
Admission: EM | Admit: 2018-11-13 | Discharge: 2018-11-13 | Disposition: A | Discharge status: Home / Self Care | Payer: Self-pay | Source: Home / Self Care | Attending: Cardiology | Admitting: Cardiology

## 2018-11-13 ENCOUNTER — Ambulatory Visit (HOSPITAL_COMMUNITY): Payer: Self-pay

## 2018-11-13 DIAGNOSIS — R0789 Other chest pain: Secondary | ICD-10-CM

## 2018-11-13 DIAGNOSIS — R079 Chest pain, unspecified: Secondary | ICD-10-CM

## 2018-11-13 DIAGNOSIS — K219 Gastro-esophageal reflux disease without esophagitis: Secondary | ICD-10-CM

## 2018-11-13 LAB — ECHOCARDIOGRAM COMPLETE
Height: 66 in
Weight: 2400 oz

## 2018-11-13 MED ORDER — REGADENOSON 0.4 MG/5ML IV SOLN
0.4000 mg | Freq: Once | INTRAVENOUS | Status: AC
  Administered 2018-11-13: 0.4 mg via INTRAVENOUS
  Filled 2018-11-13: qty 5

## 2018-11-13 MED ORDER — TECHNETIUM TC 99M TETROFOSMIN IV KIT
30.0000 | PACK | Freq: Once | INTRAVENOUS | Status: AC | PRN
  Administered 2018-11-13: 30 via INTRAVENOUS

## 2018-11-13 MED ORDER — TECHNETIUM TC 99M TETROFOSMIN IV KIT
10.0000 | PACK | Freq: Once | INTRAVENOUS | Status: AC | PRN
  Administered 2018-11-13: 10 via INTRAVENOUS

## 2018-11-13 MED ORDER — IBUPROFEN 200 MG PO TABS
400.0000 mg | ORAL_TABLET | Freq: Four times a day (QID) | ORAL | Status: DC | PRN
  Administered 2018-11-13: 400 mg via ORAL
  Filled 2018-11-13: qty 2

## 2018-11-13 MED ORDER — ACETAMINOPHEN 325 MG PO TABS: 650.0000 mg | ORAL_TABLET | ORAL | Status: DC | PRN

## 2018-11-13 MED ORDER — REGADENOSON 0.4 MG/5ML IV SOLN
INTRAVENOUS | Status: AC
  Filled 2018-11-13: qty 5

## 2018-11-13 MED ORDER — OMEPRAZOLE 20 MG PO CPDR: 40.0000 mg | DELAYED_RELEASE_CAPSULE | Freq: Every day | ORAL | 1 refills | Status: DC

## 2018-11-13 NOTE — Discharge Summary (Signed)
Physician Discharge Summary  Melah Ebling WUJ:[id number] DOB: 1966-08-13 DOA: 11/11/2018  PCP: Patient, No Pcp Per  Admit date: 11/11/2018 Discharge date: 11/13/2018  Recommendations for Outpatient Follow-up:  1. Atypical chest pain, suspect GERD, suggest outpatient evaluation.   Follow-up Information    [facility]. Schedule an appointment as soon as possible for a visit in 2 week(s).   Contact information: [address], [place] [address]           Discharge Diagnoses: Principal diagnosis is #1 1. Atypical chest pain 2. GERD  Discharge Condition: improved Disposition: home  Diet recommendation: resume home diet  Filed Weights   11/11/18 1343  Weight: 68 kg    History of present illness:  53 year old woman presented with atypical chest pain.  Admitted for further evaluation.  Hospital Course:  ACS was ruled out, troponins negative, EKG nonacute.  Seen by cardiology with recommendation for nuclear stress test which was low risk.  Echocardiogram was reassuring.  She was cleared by cardiology for discharge.  Asymptomatic bradycardia was unremarkable.  In further history taking, the patient is active and walks several miles per day and never has chest pain or shortness of breath with this.  Her episodes of chest pain seem to occur randomly and she thinks they are connected with food and definitely stress.  She reports while being on an increased dose of PPI here, she has had no further episodes.  She has no history of leg edema or signs or symptoms to suggest blood clot.  Taken together history is most suggestive of GERD.  PPI dose was increased on discharge and she can follow-up with her primary care physician as an outpatient.  Consultants: None  Procedure:  Echo IMPRESSIONS    1. The left ventricle has normal systolic function with an ejection fraction of 60-65%. The cavity size was normal. Left ventricular  diastolic parameters were normal.  2. The right ventricle has normal systolic function. The cavity was normal. There is no increase in right ventricular wall thickness.  3. The mitral valve is normal in structure.  4. The tricuspid valve is normal in structure.  5. The aortic valve is tricuspid Mild thickening of the aortic valve.  6. The pulmonic valve was normal in structure.  Today's assessment: S: No chest pain episodes during hospitalization.  She thinks the increased dose of PPI has been helping.  No shortness of breath. O: Vitals:  Vitals:   11/13/18 1028 11/13/18 1356  BP: (!) 98/59 101/62  Pulse:  (!) 59  Resp:  19  Temp:  97.8 F (36.6 C)  SpO2:  100%    Constitutional:  . Appears calm and comfortable Respiratory:  . CTA bilaterally, no w/r/r.  . Respiratory effort normal.  Cardiovascular:  . RRR, no m/r/g . No LE extremity edema   . Telemetry sinus rhythm Psychiatric:  . judgement and insight appear normal . Mental status o Mood, affect appropriate  Hemoglobin stable at 9.3 troponins negative, note the patient refused the last troponin as well as a fasting lipid profile. CBC and BMP, hepatic function panel unremarkable.  TSH within normal limits. Nuclear medicine stress test low risk, echocardiogram unremarkable EKG Marked sinus bradycardia, no acute changes  Discharge Instructions  Discharge Instructions    Activity as tolerated - No restrictions   Complete by:  As directed    Discharge instructions   Complete by:  As directed    Call your physician or seek immediate medical  attention for pain, shortness of breath, fever or worsening of condition. Continue current diet.     Allergies as of 11/13/2018      Reactions   Other Hives   seafood      Medication List    TAKE these medications   omeprazole 20 MG capsule Commonly known as:  PRILOSEC Take 2 capsules (40 mg total) by mouth daily. What changed:  how much to take      Allergies    Allergen Reactions  . Other Hives    seafood    The results of significant diagnostics from this hospitalization (including imaging, microbiology, ancillary and laboratory) are listed below for reference.    Significant Diagnostic Studies: Dg Chest 2 View  Result Date: 11/11/2018 CLINICAL DATA:  Chest pain and weakness. EXAM: CHEST - 2 VIEW COMPARISON:  06/11/2018 FINDINGS: The cardiac silhouette, mediastinal and hilar contours are within normal limits and stable. The lungs are clear. No infiltrates, edema or effusions. No worrisome pulmonary lesions. The bony thorax is intact. IMPRESSION: No acute cardiopulmonary findings. Electronically Signed   By: Marijo Sanes M.D.   On: 11/11/2018 14:36   Nm Myocar Multi W/spect W/wall Motion / Ef  Result Date: 11/13/2018 CLINICAL DATA:  Chest pain. EXAM: MYOCARDIAL IMAGING WITH SPECT (REST AND PHARMACOLOGIC-STRESS) GATED LEFT VENTRICULAR WALL MOTION STUDY LEFT VENTRICULAR EJECTION FRACTION TECHNIQUE: Standard myocardial SPECT imaging was performed after resting intravenous injection of 10 mCi Tc-89m tetrofosmin. Subsequently, intravenous infusion of Lexiscan was performed under the supervision of the Cardiology staff. At peak effect of the drug, 30 mCi Tc-64m tetrofosmin was injected intravenously and standard myocardial SPECT imaging was performed. Quantitative gated imaging was also performed to evaluate left ventricular wall motion, and estimate left ventricular ejection fraction. COMPARISON:  None. FINDINGS: Perfusion: No decreased activity in the left ventricle on stress imaging to suggest reversible ischemia or infarction. Wall Motion: Normal left ventricular wall motion. No left ventricular dilation. Left Ventricular Ejection Fraction: 71 % End diastolic volume 92 ml End systolic volume 26 ml IMPRESSION: 1. No reversible ischemia or infarction. 2. Normal left ventricular wall motion. 3. Left ventricular ejection fraction 71% 4. Non invasive risk  stratification*: Low risk *2012 Appropriate Use Criteria for Coronary Revascularization Focused Update: J Am Coll Cardiol. 5732;20(2):542-706. http://content.airportbarriers.com.aspx?articleid=1201161 Electronically Signed   By: Marijo Sanes M.D.   On: 11/13/2018 15:30    Labs: Basic Metabolic Panel: Recent Labs  Lab 11/11/18 1358  NA 139  K 4.6  CL 107  CO2 25  GLUCOSE 88  BUN 15  CREATININE 0.81  CALCIUM 8.8*   CBC: Recent Labs  Lab 11/11/18 1358  WBC 5.5  HGB 12.7  HCT 41.1  MCV 102.2*  PLT 151   Cardiac Enzymes: Recent Labs  Lab 11/11/18 2141 11/12/18 0220  TROPONINI <0.03 <0.03      Active Problems:   Chest pain, rule out acute myocardial infarction   Time coordinating discharge: 40 minutes  Signed:  Murray Hodgkins, MD  [facility]  11/13/2018, 7:01 PM

## 2018-11-13 NOTE — Progress Notes (Signed)
  Echocardiogram 2D Echocardiogram has been performed.  Jannett Celestine 11/13/2018, 2:02 PM

## 2018-11-13 NOTE — Progress Notes (Signed)
Attempted echo in the AM.  Patient was at [facility] for a nuclear medicine exam, per nurse.  Will attempt later

## 2018-11-13 NOTE — Progress Notes (Signed)
Patient is being transported over to [facility] by [facility] this morning for a nuclear medicine stress test. Will assess and medicate patient returns.

## 2018-11-13 NOTE — Progress Notes (Signed)
Subjective:  Denies any chest pain or shortness of breath.  Seen in nuclear medicine department tolerated stress portion of the Lexiscan Myoview okay  Objective:  Vital Signs in the last 24 hours: Temp:  [97.5 F (36.4 C)-98.4 F (36.9 C)] 97.8 F (36.6 C) (02/19 0552) Pulse Rate:  [62-65] 62 (02/19 0552) Resp:  [18-19] 18 (02/19 0552) BP: (88-124)/(57-82) 98/59 (02/19 1028) SpO2:  [96 %-100 %] 100 % (02/19 0552)  Intake/Output from previous day: 02/18 0701 - 02/19 0700 In: 480 [P.O.:480] Out: 3 [Urine:3] Intake/Output from this shift: No intake/output data recorded.  Physical Exam: Neck: no adenopathy, no carotid bruit, no JVD and supple, symmetrical, trachea midline Lungs: clear to auscultation bilaterally Heart: regular rate and rhythm, S1, S2 normal and Soft systolic murmur noted Abdomen: soft, non-tender; bowel sounds normal; no masses,  no organomegaly Extremities: extremities normal, atraumatic, no cyanosis or edema  Lab Results: Recent Labs    11/11/18 1358  WBC 5.5  HGB 12.7  PLT 151   Recent Labs    11/11/18 1358  NA 139  K 4.6  CL 107  CO2 25  GLUCOSE 88  BUN 15  CREATININE 0.81   Recent Labs    11/11/18 2141 11/12/18 0220  TROPONINI <0.03 <0.03   Hepatic Function Panel No results for input(s): PROT, ALBUMIN, AST, ALT, ALKPHOS, BILITOT, BILIDIR, IBILI in the last 72 hours. No results for input(s): CHOL in the last 72 hours. No results for input(s): PROTIME in the last 72 hours.  Imaging: Imaging results have been reviewed and Dg Chest 2 View  Result Date: 11/11/2018 CLINICAL DATA:  Chest pain and weakness. EXAM: CHEST - 2 VIEW COMPARISON:  06/11/2018 FINDINGS: The cardiac silhouette, mediastinal and hilar contours are within normal limits and stable. The lungs are clear. No infiltrates, edema or effusions. No worrisome pulmonary lesions. The bony thorax is intact. IMPRESSION: No acute cardiopulmonary findings. Electronically Signed   By: Marijo Sanes M.D.   On: 11/11/2018 14:36    Cardiac Studies:  Assessment/Plan:  Status post chest pain MI ruled out Status post gastric sleeve surgery in the past GERD Positive family history of coronary artery disease History of sciatica Plan Continue present management Okay to discharge from cardiac point of view if Lexiscan Myoview shows no evidence of reversible ischemia  LOS: 0 days    Charolette Forward 11/13/2018, 10:33 AM

## 2019-02-25 ENCOUNTER — Telehealth: Payer: Self-pay | Admitting: *Deleted

## 2019-02-25 NOTE — Telephone Encounter (Signed)
Copied from [place] [id number]. Topic: Appointment Scheduling - Scheduling Inquiry for Clinic >> Feb 24, 2019  4:51 PM Mathis Bud wrote: Reason for CRM: patient is wanting to established care with any provider. Patient just needs a PCP. Patient call back [phone]

## 2019-03-12 ENCOUNTER — Encounter: Payer: Self-pay | Admitting: Medical

## 2019-03-12 NOTE — Progress Notes (Signed)
Subjective:    Patient ID: Rita Miranda, female    DOB: 1965/10/24, 53 y.o.   MRN: [id number]  HPI  Pt seen for first time time to get established.  She has hx of gerd, atypical chest pain and asymptomatic bradycardia on epic chart review hospitalization note in feb.  FH of CAD in various family members.    Prior hospital dc notes indicate negative cardiac work up Hospital Course: 2/172020-11/13/2018 ACS was ruled out, troponins negative, EKG nonacute.  Seen by cardiology with recommendation for nuclear stress test which was low risk.  Echocardiogram was reassuring.  She was cleared by cardiology for discharge.  Asymptomatic bradycardia was unremarkable.  In further history taking, the patient is active and walks several miles per day and never has chest pain or shortness of breath with this.  Her episodes of chest pain seem to occur randomly and she thinks they are connected with food and definitely stress.  She reports while being on an increased dose of PPI here, she has had no further episodes.  She has no history of leg edema or signs or symptoms to suggest blood clot.  Taken together history is most suggestive of GERD.  PPI dose was increased on discharge and she can follow-up with her primary care physician as an outpatient.  Pt give me update that she makes comments that she is still not sure why feel occasional severe fatigue, light headed, weak and at times almost presyncopal type feeling. Pt describes in past has near syncope. Pt states cardiologist in hospital told her she would need pacemaker in future. Last time may have symptomatic bradycardia was Saturday. Occurred while sitting  Pt also states some chest pain at times on exertion. But none recently.    Pt also states severe heart burn daily after eating despite use of omeprazole. Eats healthy but symptoms persist. Pt states after gastric bypass surgery no gerd after surgery. But now recurrent in February. Pt states had egd  before sleave.    Employment-  exercise-not presently , diet-overall healthy , married- 3 boys.  Appears up to date on vaccine per epic. Will need to get old rcords.  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  Last pap-last 2 years ago. Hx of parital hysterecomy 53 yo ago. Last mammogram- 2 years ago.  Last wellness exam- 2 years ago.       Review of Systems  Constitutional: Negative for chills, fatigue and fever.  HENT: Negative for congestion, ear discharge and ear pain.   Respiratory: Negative for cough, chest tightness, shortness of breath and wheezing.   Cardiovascular: Negative for chest pain and palpitations.  Gastrointestinal: Negative for abdominal pain.  Musculoskeletal: Negative for back pain and neck pain.  Skin: Negative for rash.  Neurological: Negative for dizziness, weakness and headaches.  Psychiatric/Behavioral: Negative for agitation, behavioral problems, decreased concentration and suicidal ideas. The patient is not nervous/anxious.    Past Medical History:  Diagnosis Date  . Cancer ([place])    uterine  . GERD (gastroesophageal reflux disease)      Social History   Socioeconomic History  . Marital status: Married    Spouse name: Not on file  . Number of children: Not on file  . Years of education: Not on file  . Highest education level: Not on file  Occupational History  . Not on file  Social Needs  . Financial resource strain: Not on file  . Food insecurity    Worry: Not on  file    Inability: Not on file  . Transportation needs    Medical: Not on file    Non-medical: Not on file  Tobacco Use  . Smoking status: Never Smoker  . Smokeless tobacco: Never Used  Substance and Sexual Activity  . Alcohol use: No  . Drug use: No  . Sexual activity: Not on file  Lifestyle  . Physical activity    Days per week: Not on file    Minutes per session: Not on file  . Stress: Not on file  Relationships  . Social Herbalist on  phone: Not on file    Gets together: Not on file    Attends religious service: Not on file    Active member of club or organization: Not on file    Attends meetings of clubs or organizations: Not on file    Relationship status: Not on file  . Intimate partner violence    Fear of current or ex partner: Not on file    Emotionally abused: Not on file    Physically abused: Not on file    Forced sexual activity: Not on file  Other Topics Concern  . Not on file  Social History Narrative  . Not on file    Past Surgical History:  Procedure Laterality Date  . ABDOMINAL HYSTERECTOMY    . LAPAROSCOPIC GASTRIC SLEEVE RESECTION WITH HIATAL HERNIA REPAIR      Family History  Problem Relation Age of Onset  . Heart disease Mother   . Coronary artery disease Brother     Allergies  Allergen Reactions  . Other Hives    seafood    Current Outpatient Medications on File Prior to Visit  Medication Sig Dispense Refill  . omeprazole (PRILOSEC) 20 MG capsule Take 2 capsules (40 mg total) by mouth daily. 60 capsule 1   No current facility-administered medications on file prior to visit.     There were no vitals taken for this visit.      Objective:   Physical Exam  General Mental Status- Alert. General Appearance- Not in acute distress.   Skin General: Color- Normal Color. Moisture- Normal Moisture.  Neck Carotid Arteries- Normal color. Moisture- Normal Moisture. No carotid bruits. No JVD.  Chest and Lung Exam Auscultation: Breath Sounds:-Normal.  Cardiovascular Auscultation:Rythm- Regular. Murmurs & Other Heart Sounds:Auscultation of the heart reveals- No Murmurs.  Abdomen Inspection:-Inspeection Normal. Palpation/Percussion:Note:No mass. Palpation and Percussion of the abdomen reveal- Non Tender, Non Distended + BS, no rebound or guarding.  Neurologic Cranial Nerve exam:- CN III-XII intact(No nystagmus), symmetric smile. Strength:- 5/5 equal and symmetric strength both  upper and lower extremities.       Assessment & Plan:  For hx of bradycardia and recent symptoms of fatigue, dizziness and near syncope, I have concern that you may have sick sinus syndrome as cardiologist in hospital told you that you would eventually need pacemaker. You do have bradycardia today on exam as well. Will go ahead and refer you to cardiologist in our [place]. I am asking they see you soon. You can call them and explain I have placed referral.   For atypical chest pain at times and some on exertion, I am getting troponin stat today. No current symptoms/none since weekend but if you have elevated troponin or sustained chest pain or severe symptoms as above then recommend ED evaluation down stairs. No exercise until evaluated by cardilogist.  For gerd hx. Adding famotadine to prilosec. If symptoms perist  after eating the refer to new gi md for repeat egd.  For fatigue getting cbc, cmp, tsh, b1, b12, and tsh.  Follow up in 2 weeks with me or as needed  [phone]    Mackie Pai, PA-C

## 2019-03-13 ENCOUNTER — Other Ambulatory Visit: Payer: Self-pay

## 2019-03-13 ENCOUNTER — Ambulatory Visit (INDEPENDENT_AMBULATORY_CARE_PROVIDER_SITE_OTHER): Payer: No Typology Code available for payment source | Admitting: Medical

## 2019-03-13 ENCOUNTER — Encounter: Payer: Self-pay | Admitting: Medical

## 2019-03-13 VITALS — BP 115/88 | HR 58 | Temp 97.7°F | Resp 16 | Ht 66.0 in | Wt 171.4 lb

## 2019-03-13 DIAGNOSIS — R0789 Other chest pain: Secondary | ICD-10-CM | POA: Diagnosis not present

## 2019-03-13 DIAGNOSIS — R5383 Other fatigue: Secondary | ICD-10-CM | POA: Diagnosis not present

## 2019-03-13 DIAGNOSIS — R001 Bradycardia, unspecified: Secondary | ICD-10-CM

## 2019-03-13 DIAGNOSIS — K219 Gastro-esophageal reflux disease without esophagitis: Secondary | ICD-10-CM | POA: Diagnosis not present

## 2019-03-13 LAB — VITAMIN B12: Vitamin B-12: 76 pg/mL — ABNORMAL LOW (ref 211–911)

## 2019-03-13 LAB — COMPREHENSIVE METABOLIC PANEL
ALT: 10 U/L (ref 0–35)
AST: 15 U/L (ref 0–37)
Albumin: 4.1 g/dL (ref 3.5–5.2)
Alkaline Phosphatase: 60 U/L (ref 39–117)
BUN: 13 mg/dL (ref 6–23)
CO2: 31 mEq/L (ref 19–32)
Calcium: 9.5 mg/dL (ref 8.4–10.5)
Chloride: 103 mEq/L (ref 96–112)
Creatinine, Ser: 0.76 mg/dL (ref 0.40–1.20)
GFR: 80.11 mL/min (ref >60.00)
Glucose, Bld: 92 mg/dL (ref 70–99)
Potassium: 5.2 mEq/L — ABNORMAL HIGH (ref 3.5–5.1)
Sodium: 139 mEq/L (ref 135–145)
Total Bilirubin: 0.4 mg/dL (ref 0.2–1.2)
Total Protein: 6.7 g/dL (ref 6.0–8.3)

## 2019-03-13 LAB — CBC WITH DIFFERENTIAL/PLATELET
Basophils Absolute: 0 10*3/uL (ref 0.0–0.1)
Basophils Relative: 0.6 % (ref 0.0–3.0)
Eosinophils Absolute: 0.1 10*3/uL (ref 0.0–0.7)
Eosinophils Relative: 1.4 % (ref 0.0–5.0)
HCT: 40.1 % (ref 36.0–46.0)
Hemoglobin: 13.6 g/dL (ref 12.0–15.0)
Lymphocytes Relative: 34 % (ref 12.0–46.0)
Lymphs Abs: 1.7 10*3/uL (ref 0.7–4.0)
MCHC: 33.8 g/dL (ref 30.0–36.0)
MCV: 95.5 fl (ref 78.0–100.0)
Monocytes Absolute: 0.5 10*3/uL (ref 0.1–1.0)
Monocytes Relative: 9.7 % (ref 3.0–12.0)
Neutro Abs: 2.8 10*3/uL (ref 1.4–7.7)
Neutrophils Relative %: 54.3 % (ref 43.0–77.0)
Platelets: 236 10*3/uL (ref 150.0–400.0)
RBC: 4.21 Mil/uL (ref 3.87–5.11)
RDW: 13.2 % (ref 11.5–15.5)
WBC: 5.1 10*3/uL (ref 4.0–10.5)

## 2019-03-13 LAB — TROPONIN I: TNIDX: 0 ug/l (ref 0.00–0.06)

## 2019-03-13 LAB — TSH: TSH: 1.32 u[IU]/mL (ref 0.35–4.50)

## 2019-03-13 MED ORDER — FAMOTIDINE 20 MG PO TABS: 20.0000 mg | ORAL_TABLET | Freq: Every day | ORAL | 0 refills | Status: DC

## 2019-03-13 NOTE — Patient Instructions (Signed)
For hx of bradycardia and recent symptoms of fatigue, dizziness and near syncope, I have concern that you may have sick sinus syndrome as cardiologist in hospital told you that you would eventually need pacemaker. You do have bradycardia today on exam as well. Will go ahead and refer you to cardiologist in our [place]. I am asking they see you soon. You can call them and explain I have placed referral.   For atypical chest pain at times and some on exertion, I am getting troponin stat today. No current symptoms/none since weekend but if you have elevated troponin or sustained chest pain or severe symptoms as above then recommend ED evaluation down stairs. No exercise until evaluated by cardilogist.  For gerd hx. Adding famotadine to prilosec. If symptoms perist after eating the refer to new gi md for repeat egd.  For fatigue getting cbc, cmp, tsh, b1, b12, and tsh.  Follow up in 2 weeks with me or as needed

## 2019-03-16 LAB — VITAMIN B1: Vitamin B1 (Thiamine): 11 nmol/L (ref 8–30)

## 2019-03-17 DIAGNOSIS — R001 Bradycardia, unspecified: Secondary | ICD-10-CM | POA: Insufficient documentation

## 2019-03-17 HISTORY — DX: Bradycardia, unspecified: R00.1

## 2019-03-17 LAB — VITAMIN D 1,25 DIHYDROXY
Vitamin D 1, 25 (OH)2 Total: 48 pg/mL (ref 18–72)
Vitamin D2 1, 25 (OH)2: <8 pg/mL
Vitamin D3 1, 25 (OH)2: 48 pg/mL

## 2019-03-17 NOTE — Progress Notes (Deleted)
Cardiology Office Note:    Date:  03/17/2019   ID:  Rita Miranda, DOB Jul 25, 1966, MRN [id number]  PCP:  Mackie Pai, PA-C  Cardiologist:  Shirlee More, MD   Referring MD: Mackie Pai, PA-C  ASSESSMENT:    No diagnosis found. PLAN:    In order of problems listed above:  1. ***  Next appointment   Medication Adjustments/Labs and Tests Ordered: Current medicines are reviewed at length with the patient today.  Concerns regarding medicines are outlined above.  No orders of the defined types were placed in this encounter.  No orders of the defined types were placed in this encounter.    No chief complaint on file. ***  History of Present Illness:    Rita Miranda is a 53 y.o. female who is being seen today for the evaluation of bradycardia and near syncope at the request of [place], Percell Miller, [place].  She also has a history of gastric sleeve surgery and gastroesophageal reflux  She had a hospital admission for chest pain February 2020.  She had a nuclear stress test that was felt to be low risk no ischemia and echocardiogram was felt to be reassuring.  Other problems included bradycardia asymptomatic.  Her echocardiogram showed an ejection fraction 60 to 65% and there was no significant valvular abnormality.  Her inpatient Myoview study performed pharmacologically with Lexiscan 11/13/2018 showed ejection fraction 71% normal perfusion no ischemia and some it was normal and low risk  Past Medical History:  Diagnosis Date  . Cancer ([place])    uterine  . GERD (gastroesophageal reflux disease)     Past Surgical History:  Procedure Laterality Date  . ABDOMINAL HYSTERECTOMY    . LAPAROSCOPIC GASTRIC SLEEVE RESECTION WITH HIATAL HERNIA REPAIR      Current Medications: No outpatient medications have been marked as taking for the 03/18/19 encounter (Appointment) with Richardo Priest, MD.     Allergies:   Other   Social History   Socioeconomic History  . Marital status:  Married    Spouse name: Not on file  . Number of children: Not on file  . Years of education: Not on file  . Highest education level: Not on file  Occupational History  . Not on file  Social Needs  . Financial resource strain: Not on file  . Food insecurity    Worry: Not on file    Inability: Not on file  . Transportation needs    Medical: Not on file    Non-medical: Not on file  Tobacco Use  . Smoking status: Never Smoker  . Smokeless tobacco: Never Used  Substance and Sexual Activity  . Alcohol use: No  . Drug use: No  . Sexual activity: Not on file  Lifestyle  . Physical activity    Days per week: Not on file    Minutes per session: Not on file  . Stress: Not on file  Relationships  . Social Herbalist on phone: Not on file    Gets together: Not on file    Attends religious service: Not on file    Active member of club or organization: Not on file    Attends meetings of clubs or organizations: Not on file    Relationship status: Not on file  Other Topics Concern  . Not on file  Social History Narrative  . Not on file     Family History: The patient's ***family history includes Coronary artery disease in her brother; Heart disease  in her mother.  ROS:   ROS Please see the history of present illness.    *** All other systems reviewed and are negative.  EKGs/Labs/Other Studies Reviewed:    The following studies were reviewed today: ***  EKG:  EKG is *** ordered today.  The ekg ordered today is personally reviewed and demonstrates *** EKG 03/14/2019 independently reviewed showed sinus bradycardia 47 bpm otherwise normal Recent Labs: 06/11/2018: Magnesium 2.1 03/13/2019: ALT 10; BUN 13; Creatinine, Ser 0.76; Hemoglobin 13.6; Platelets 236.0; Potassium 5.2; Sodium 139; TSH 1.32  Recent Lipid Panel No results found for: CHOL, TRIG, HDL, CHOLHDL, VLDL, LDLCALC, LDLDIRECT  Physical Exam:    VS:  There were no vitals taken for this visit.    Wt  Readings from Last 3 Encounters:  03/13/19 171 lb 6.4 oz (77.7 kg)  11/11/18 150 lb (68 kg)  06/11/18 140 lb (63.5 kg)     GEN: *** Well nourished, well developed in no acute distress HEENT: Normal NECK: No JVD; No carotid bruits LYMPHATICS: No lymphadenopathy CARDIAC: ***RRR, no murmurs, rubs, gallops RESPIRATORY:  Clear to auscultation without rales, wheezing or rhonchi  ABDOMEN: Soft, non-tender, non-distended MUSCULOSKELETAL:  No edema; No deformity  SKIN: Warm and dry NEUROLOGIC:  Alert and oriented x 3 PSYCHIATRIC:  Normal affect     Signed, Shirlee More, MD  03/17/2019 8:05 AM    [place]

## 2019-03-18 ENCOUNTER — Ambulatory Visit: Payer: No Typology Code available for payment source | Admitting: Cardiology

## 2019-03-27 ENCOUNTER — Ambulatory Visit (INDEPENDENT_AMBULATORY_CARE_PROVIDER_SITE_OTHER): Payer: No Typology Code available for payment source | Admitting: Medical

## 2019-03-27 ENCOUNTER — Other Ambulatory Visit: Payer: Self-pay

## 2019-03-27 ENCOUNTER — Ambulatory Visit: Payer: No Typology Code available for payment source | Admitting: Medical

## 2019-03-27 DIAGNOSIS — R5383 Other fatigue: Secondary | ICD-10-CM | POA: Diagnosis not present

## 2019-03-27 DIAGNOSIS — R001 Bradycardia, unspecified: Secondary | ICD-10-CM | POA: Diagnosis not present

## 2019-03-27 DIAGNOSIS — R252 Cramp and spasm: Secondary | ICD-10-CM

## 2019-03-27 DIAGNOSIS — E538 Deficiency of other specified B group vitamins: Secondary | ICD-10-CM | POA: Diagnosis not present

## 2019-03-27 MED ORDER — FAMOTIDINE 20 MG PO TABS: 20.0000 mg | ORAL_TABLET | Freq: Every day | ORAL | 3 refills | Status: DC

## 2019-03-27 MED FILL — SM ACID REDUCER 20 MG TAB: 20 | 25 days supply | Qty: 25 | Fill #0

## 2019-03-27 NOTE — Patient Instructions (Addendum)
Glad to hear that your [facility] your test were negative and that you do not have any COVID type symptoms.  We discussed your cancel cardiology appointment then want you to go ahead and call them to get you rescheduled.  You have some recent mild intermittent cramping of your calves but normal electrolyte studies recently.  Will you to make sure that you stay well hydrated.  I went ahead and put future metabolic panel and magnesium level.  You can get those just need to call our office to be placed on lab schedule.  If you have any worsening cramping recommended to get those done sooner rather than later.  Glad to hear that you are severe near syncope episodes have resolved.  You are still having some bradycardia and do want the cardiologist opinion to determine significance of this.  They might want to do Holter monitor.  He has also noted significant fatigue and your B12 was very low.  For B12 monthly injections but you preferred use daily over-the-counter supplementation due to the pandemic.  Glad to hear that your stomach feels better with famotidine.  Follow-up date to be determined after reviewing cardiology referral notes.

## 2019-03-27 NOTE — Progress Notes (Signed)
Subjective:    Patient ID: Rita Miranda, female    DOB: December 15, 1965, 53 y.o.   MRN: [id number]  HPI  Virtual Visit via Telephone Note  I connected with Rita Miranda on 03/27/19 at 10:40 AM EDT by telephone and verified that I am speaking with the correct person using two identifiers.  Location: Patient: home Provider: office.   I discussed the limitations, risks, security and privacy concerns of performing an evaluation and management service by telephone and the availability of in person appointments. I also discussed with the patient that there may be a patient responsible charge related to this service. The patient expressed understanding and agreed to proceed.   History of Present Illness:   Pt states her cardiologist canceled. 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 She works at [employer]. Both test came back negative.  Appointment with cardiologist was canceled due to recent covid testing. Pt has no symptoms in past and none today.  Pt had no severe near syncope episodes since last visit.  Pt check her bp and pulse. At rest her bp 50's mid 40's. With acitivity up to 98   Pt stomach feels better with famotadine.  Low b12 hx. She just wants to use otc b12 and declines injection monthyl  Some small cramps in calfs rare and intermittent.      Observations/Objective: General- no acute distress, pleasant. Oriented, normal speach   Assessment and Plan: Glad to hear that your [facility] your test were negative and that you do not have any COVID type symptoms.  We discussed your cancel cardiology appointment then want you to go ahead and call them to get you rescheduled.  You have some recent mild intermittent cramping of your calves but normal electrolyte studies recently.  Will you to make sure that you stay well hydrated.  I went ahead and put future metabolic panel and magnesium level.  You can get those  just need to call our office to be placed on lab schedule.  If you have any worsening cramping recommended to get those done sooner rather than later.  Glad to hear that you are severe near syncope episodes have resolved.  You are still having some bradycardia and do want the cardiologist opinion to determine significance of this.  They might want to do Holter monitor.  He has also noted significant fatigue and your B12 was very low.  For B12 monthly injections but you preferred use daily over-the-counter supplementation due to the pandemic.  Glad to hear that your stomach feels better with famotidine.  Follow-up date to be determined after reviewing cardiology referral notes.  Follow Up Instructions:    I discussed the assessment and treatment plan with the patient. The patient was provided an opportunity to ask questions and all were answered. The patient agreed with the plan and demonstrated an understanding of the instructions.   The patient was advised to call back or seek an in-person evaluation if the symptoms worsen or if the condition fails to improve as anticipated.  I provided 15 minutes of non-face-to-face time during this encounter.   Mackie Pai, PA-C    Review of Systems  Constitutional: Negative for chills, fatigue and fever.  HENT: Negative for congestion, ear discharge, ear pain, hearing loss, nosebleeds and postnasal drip.   Respiratory: Negative for cough, chest tightness, shortness of breath and wheezing.   Cardiovascular: Negative for chest pain and palpitations.  Gastrointestinal:  Negative for abdominal pain.  Musculoskeletal: Negative for back pain.  Skin: Negative for rash.  Neurological: Negative for dizziness, speech difficulty, weakness and headaches.  Hematological: Negative for adenopathy. Does not bruise/bleed easily.  Psychiatric/Behavioral: Negative for behavioral problems and confusion.       Objective:   Physical Exam         Assessment & Plan:

## 2019-04-05 ENCOUNTER — Other Ambulatory Visit: Payer: Self-pay | Admitting: Medical

## 2019-05-10 ENCOUNTER — Emergency Department (HOSPITAL_BASED_OUTPATIENT_CLINIC_OR_DEPARTMENT_OTHER)
Admission: EM | Admit: 2019-05-10 | Discharge: 2019-05-10 | Disposition: A | Discharge status: Home / Self Care | Payer: No Typology Code available for payment source | Attending: Emergency Medicine | Admitting: Emergency Medicine

## 2019-05-10 ENCOUNTER — Emergency Department (HOSPITAL_BASED_OUTPATIENT_CLINIC_OR_DEPARTMENT_OTHER): Payer: No Typology Code available for payment source

## 2019-05-10 ENCOUNTER — Other Ambulatory Visit: Payer: Self-pay

## 2019-05-10 ENCOUNTER — Encounter (HOSPITAL_BASED_OUTPATIENT_CLINIC_OR_DEPARTMENT_OTHER): Payer: Self-pay | Admitting: Emergency Medicine

## 2019-05-10 DIAGNOSIS — Z8542 Personal history of malignant neoplasm of other parts of uterus: Secondary | ICD-10-CM | POA: Diagnosis not present

## 2019-05-10 DIAGNOSIS — Z79899 Other long term (current) drug therapy: Secondary | ICD-10-CM | POA: Diagnosis not present

## 2019-05-10 DIAGNOSIS — R42 Dizziness and giddiness: Secondary | ICD-10-CM | POA: Diagnosis not present

## 2019-05-10 DIAGNOSIS — R51 Headache: Secondary | ICD-10-CM | POA: Insufficient documentation

## 2019-05-10 DIAGNOSIS — R0789 Other chest pain: Secondary | ICD-10-CM | POA: Insufficient documentation

## 2019-05-10 LAB — CBC WITH DIFFERENTIAL/PLATELET
Abs Immature Granulocytes: 0.01 10*3/uL (ref 0.00–0.07)
Basophils Absolute: 0 10*3/uL (ref 0.0–0.1)
Basophils Relative: 1 %
Eosinophils Absolute: 0.1 10*3/uL (ref 0.0–0.5)
Eosinophils Relative: 2 %
HCT: 40.6 % (ref 36.0–46.0)
Hemoglobin: 13.1 g/dL (ref 12.0–15.0)
Immature Granulocytes: 0 %
Lymphocytes Relative: 32 %
Lymphs Abs: 1.9 10*3/uL (ref 0.7–4.0)
MCH: 30.9 pg (ref 26.0–34.0)
MCHC: 32.3 g/dL (ref 30.0–36.0)
MCV: 95.8 fL (ref 80.0–100.0)
Monocytes Absolute: 0.6 10*3/uL (ref 0.1–1.0)
Monocytes Relative: 10 %
Neutro Abs: 3.4 10*3/uL (ref 1.7–7.7)
Neutrophils Relative %: 55 %
Platelets: 243 10*3/uL (ref 150–400)
RBC: 4.24 MIL/uL (ref 3.87–5.11)
RDW: 12.9 % (ref 11.5–15.5)
WBC: 6 10*3/uL (ref 4.0–10.5)
nRBC: 0 % (ref 0.0–0.2)

## 2019-05-10 LAB — BASIC METABOLIC PANEL
Anion gap: 8 (ref 5–15)
BUN: 14 mg/dL (ref 6–20)
CO2: 27 mmol/L (ref 22–32)
Calcium: 9.2 mg/dL (ref 8.9–10.3)
Chloride: 104 mmol/L (ref 98–111)
Creatinine, Ser: 0.8 mg/dL (ref 0.44–1.00)
GFR calc Af Amer: >60 mL/min (ref >60)
GFR calc non Af Amer: >60 mL/min (ref >60)
Glucose, Bld: 119 mg/dL — ABNORMAL HIGH (ref 70–99)
Potassium: 4.1 mmol/L (ref 3.5–5.1)
Sodium: 139 mmol/L (ref 135–145)

## 2019-05-10 LAB — TROPONIN I (HIGH SENSITIVITY)
Troponin I (High Sensitivity): <2 ng/L (ref <18)
Troponin I (High Sensitivity): <2 ng/L (ref <18)

## 2019-05-10 MED ORDER — NAPROXEN 375 MG PO TABS: 375.0000 mg | ORAL_TABLET | Freq: Two times a day (BID) | ORAL | 0 refills | Status: DC

## 2019-05-10 NOTE — ED Triage Notes (Signed)
Patient states that since last night she has had chest pain and SOB  - she reports that it has been happening since last night - she has recently been dx with bradycardia. The patient is awake and alert  - no distress noted. The patient states that the pain is worse after she eats. The patient denies any SOB when she is in the airconditioning

## 2019-05-10 NOTE — ED Notes (Signed)
Pt hooked up to monitor after returning from bathroom. V/S wnl

## 2019-05-10 NOTE — ED Notes (Signed)
Pt laid flat for orthostatic vitals

## 2019-05-10 NOTE — ED Notes (Signed)
Pt verbalized understanding of dc instructions.

## 2019-05-10 NOTE — Discharge Instructions (Addendum)
Please call and follow-up with your cardiologist as previously scheduled.  Return to the ED if your condition worsen or if you have any other concern.

## 2019-05-10 NOTE — ED Provider Notes (Signed)
[facility] EMERGENCY DEPARTMENT Provider Note   CSN: [id number] Arrival date & time: 05/10/19  1322     History   Chief Complaint Chief Complaint  Patient presents with  . Chest Pain    HPI Rita Miranda is a 53 y.o. female.     The history is provided by the patient and medical records. No language interpreter was used.  Chest Pain    53 year old female with history of GERD, sinus bradycardia, non-smoker presenting for evaluation of chest pain.  Patient reports since February she has had intermittent chest discomfort.  She described the discomfort as a pressure sensation across her chest that happen sporadically sometimes lasting for minutes sometimes lasting for hours sometimes brought on by exertion and other times it can happen at rest.  She felt the symptoms is becoming more frequent.  Last episode was last night lasting for approximately 3 hours after dinner.  Pain started earlier today but has since resolved.  Today's episode lasting for a few minutes.  She report at times if she losing her bra it makes her feel better.  She endorsed occasional bouts of lightheadedness, headache, occasional feeling flushed.  She does not complain of any significant shortness of breath or productive cough no fever chills no nausea vomiting or diarrhea no arm pain.  She mention having admitted to the hospital earlier this year for her symptom.  States that she had a heart catheterization but it was normal.  She was also diagnosed with having bradycardia and was told at some point she may need a pacemaker.  She does have an appointment with a cardiologist next week.  She does have history of GERD but states that this felt different.  No recent sick contact.  Does have family history of cardiac disease.  Patient states she used to be very active able to walk at least 2 miles daily but this year she is having increasing fatigue with exertion.  No prior history of PE or DVT.  Past Medical  History:  Diagnosis Date  . Cancer ([place])    uterine  . Chest pain in adult 11/11/2018  . GERD (gastroesophageal reflux disease)   . Sinus bradycardia 03/17/2019    Patient Active Problem List   Diagnosis Date Noted  . Sinus bradycardia 03/17/2019  . Chest pain in adult 11/11/2018    Past Surgical History:  Procedure Laterality Date  . ABDOMINAL HYSTERECTOMY    . LAPAROSCOPIC GASTRIC SLEEVE RESECTION WITH HIATAL HERNIA REPAIR       OB History   No obstetric history on file.      Home Medications    Prior to Admission medications   Medication Sig Start Date End Date Taking? Authorizing Provider  esomeprazole (NEXIUM) 20 MG packet Take 20 mg by mouth 2 (two) times daily.    [provider]  famotidine (PEPCID) 20 MG tablet TAKE 1 TABLET BY MOUTH EVERY DAY 04/07/19   Saguier, Percell Miller, PA-C    Family History Family History  Problem Relation Age of Onset  . Heart disease Mother   . Coronary artery disease Brother     Social History Social History   Tobacco Use  . Smoking status: Never Smoker  . Smokeless tobacco: Never Used  Substance Use Topics  . Alcohol use: No  . Drug use: No     Allergies   Other   Review of Systems Review of Systems  Cardiovascular: Positive for chest pain.  All other systems reviewed and are negative.  Physical Exam Updated Vital Signs BP 100/81 (BP Location: Right Arm)   Pulse 73   Temp 97.8 F (36.6 C) (Oral)   Resp 18   Ht 5\' 7"  (1.702 m)   Wt 77.1 kg   SpO2 100%   BMI 26.63 kg/m   Physical Exam Vitals signs and nursing note reviewed.  Constitutional:      General: She is not in acute distress.    Appearance: She is well-developed.  HENT:     Head: Atraumatic.  Eyes:     Conjunctiva/sclera: Conjunctivae normal.  Neck:     Musculoskeletal: Neck supple.  Cardiovascular:     Rate and Rhythm: Normal rate and regular rhythm.     Pulses: Normal pulses.     Heart sounds: Normal heart sounds.  Pulmonary:      Effort: Pulmonary effort is normal.     Breath sounds: No wheezing or rhonchi.  Chest:     Chest wall: No tenderness.  Abdominal:     General: Abdomen is flat.     Palpations: Abdomen is soft.     Tenderness: There is no abdominal tenderness.  Musculoskeletal:        General: No swelling.  Skin:    Findings: No rash.  Neurological:     General: No focal deficit present.     Mental Status: She is alert.  Psychiatric:        Mood and Affect: Mood normal.      ED Treatments / Results  Labs (all labs ordered are listed, but only abnormal results are displayed) Labs Reviewed  BASIC METABOLIC PANEL - Abnormal; Notable for the following components:      Result Value   Glucose, Bld 119 (*)    All other components within normal limits  CBC WITH DIFFERENTIAL/PLATELET  TROPONIN I (HIGH SENSITIVITY)  TROPONIN I (HIGH SENSITIVITY)    EKG EKG Interpretation  Date/Time:  Saturday May 10 2019 13:28:57 EDT Ventricular Rate:  78 PR Interval:    QRS Duration: 86 QT Interval:  386 QTC Calculation: 440 R Axis:   42 Text Interpretation:  Sinus rhythm Abnormal R-wave progression, early transition Since last tracing rate faster Confirmed by Dorie Rank [id number]) on 05/10/2019 1:42:01 PM   Radiology Dg Chest 2 View  Result Date: 05/10/2019 CLINICAL DATA:  Intermittent chest pain and pressure for the past week. EXAM: CHEST - 2 VIEW COMPARISON:  11/11/2018. FINDINGS: Normal sized heart. Clear lungs with normal vascularity. Minimal thoracic spine degenerative changes and minimal scoliosis. IMPRESSION: No acute abnormality. Electronically Signed   By: Claudie Revering M.D.   On: 05/10/2019 15:49    Procedures Procedures (including critical care time)  Medications Ordered in ED Medications - No data to display   Initial Impression / Assessment and Plan / ED Course  I have reviewed the triage vital signs and the nursing notes.  Pertinent labs & imaging results that were available  during my care of the patient were reviewed by me and considered in my medical decision making (see chart for details).        BP 102/61   Pulse (!) 54   Temp 97.8 F (36.6 C) (Oral)   Resp 19   Ht 5\' 7"  (1.702 m)   Wt 77.1 kg   SpO2 98%   BMI 26.63 kg/m    Final Clinical Impressions(s) / ED Diagnoses   Final diagnoses:  Atypical chest pain    ED Discharge Orders  Ordered    naproxen (NAPROSYN) 375 MG tablet  2 times daily     05/10/19 1512         2:18 PM Patient here with recurrent chest discomfort which has been ongoing for nearly a year.  No active chest pain at this time. HEART pathway score of 3, low risk of MACE. Low suspicion for PE based on Wells criteria. Possibly GERD, but no active pain at this time.   EKG unremarkable, chest x-ray normal, labs are reassuring, negative delta troponin, patient remains symptom-free.  At this time, patient is stable for discharge.  She will follow-up promptly with her cardiologist as previously scheduled.  Return precaution discussed. She may benefit from Holter Monitoring for c/o bradycardia.     Domenic Moras, PA-C 05/10/19 1612    Dorie Rank, MD 05/11/19 [phone]

## 2019-05-11 ENCOUNTER — Telehealth: Payer: Self-pay | Admitting: Medical

## 2019-05-11 NOTE — Telephone Encounter (Signed)
I saw that pt was seen in the ED for chest pain. Will you have patient follow up for chest pain. Best to be seen in office if have to repeat ekg. Also advise her to call the cardiologist which I referred her to in the past. They cancelled in the past but they need to reschedule her since has perssistering recurrent chest pain.

## 2019-05-13 NOTE — Progress Notes (Signed)
Cardiology Office Note:    Date:  05/14/2019   ID:  Edwin Cap, DOB Mar 17, 1966, MRN [id number]  PCP:  Mackie Pai, PA-C  Cardiologist:  Shirlee More, MD   Referring MD: Mackie Pai, PA-C  ASSESSMENT:    1. Chest pain of uncertain etiology   2. Chest pain, unspecified type   3. Pre-procedure lab exam    PLAN:    In order of problems listed above:  1. She has a history of chest pain with previous evaluation normal myocardial perfusion study.  Her symptoms have progressed to become more anginal in nature and for further evaluation will plan referral for cardiac CTA at the first opportunity her EKG remains normal in the office today if abnormal and high risk markers would benefit from angiography and revascularization.  In the interim she will continue her beta-blocker I am not can place her in aspirin with the gastroesophageal reflux disease and if she has CAD she will need a statin.  We will schedule this at the first opportunity.  She raises the issue if she needs to wear an ambulatory heart rhythm monitor and I told her I wish to defer that until we see the results of her cardiac CTA and I told her in my opinion she will not need a pacemaker in the near future.  Next appointment weeks or sooner depending on results of cardiac CTA   Medication Adjustments/Labs and Tests Ordered: Current medicines are reviewed at length with the patient today.  Concerns regarding medicines are outlined above.  Orders Placed This Encounter  Procedures  . CT CORONARY FRACTIONAL FLOW RESERVE DATA PREP  . CT CORONARY FRACTIONAL FLOW RESERVE FLUID ANALYSIS  . CT CORONARY MORPH W/CTA COR W/SCORE W/CA W/CM &/OR WO/CM  . Basic Metabolic Panel (BMET)  . EKG 12-Lead   Meds ordered this encounter  Medications  . metoprolol tartrate (LOPRESSOR) 50 MG tablet    Sig: Take 2 tablets (100 mg total) by mouth once for 1 dose. Take 2 hours before your cardiac CTA.    Dispense:  2 tablet    Refill:  0      Chief Complaint  Patient presents with  . Chest Pain    History of Present Illness:    Mauriana Dann is a 53 y.o. female who is being seen today for the evaluation of chest pain afetr [facility] ED visit 05/10/2019 at the request of Saguier, Percell Miller, [place]. She was admitted to [facility] in February and as an inpatient underwent screening evaluation which was normal.  Echo 11/13/2018:  1. The left ventricle has normal systolic function with an ejection fraction of 60-65%. The cavity size was normal. Left ventricular diastolic parameters were normal.  2. The right ventricle has normal systolic function. The cavity was normal. There is no increase in right ventricular wall thickness.  3. The mitral valve is normal in structure.  4. The tricuspid valve is normal in structure.  5. The aortic valve is tricuspid Mild thickening of the aortic valve.  6. The pulmonic valve was normal in structure.  Myoview 11/13/2018: IMPRESSION: 1. No reversible ischemia or infarction. 2. Normal left ventricular wall motion. 3. Left ventricular ejection fraction 71% 4. Non invasive risk stratification*: Low risk  EKG performed 05/10/2019 personally reviewed sinus rhythm and normal   3d ago (05/10/19) 3d ago (05/10/19)   Troponin I (High Sensitivity) <18 ng/L <2  <2 CM     She relates in the past she has been told that  she has bradycardia and a cardiologist previously told her at some point she needed a pacemaker in her life.  She has had previous episodes of chest pain and had an evaluation in [place] several years ago and at [facility].  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  She describes her chest pain is a pressure throughout the precordium right  and left side worse with activity and relieved with pain although she has had episodes of been persistent and lasted for hours at a time.  Recently she has had nocturnal palpitation of her heart seems rapid and she is quite concerned again that she needs a pacemaker she has had no documented slow heart rates or syncope.  No edema orthopnea no known history of congenital or rheumatic heart disease.  When seen by me today during discussion she starts having mild chest pain and will repeat an EKG in our office today note that her high-sensitivity troponin was not elevated.  She has a history of bariatric surgery and reflux but the symptoms are not dyspeptic and unrelated to meals or position.  She has no history of diabetes known hypertension or hyper lipidemia.  Past Medical History:  Diagnosis Date  . Cancer ([place])    uterine  . Chest pain in adult 11/11/2018  . GERD (gastroesophageal reflux disease)   . Sinus bradycardia 03/17/2019    Past Surgical History:  Procedure Laterality Date  . ABDOMINAL HYSTERECTOMY    . LAPAROSCOPIC GASTRIC SLEEVE RESECTION WITH HIATAL HERNIA REPAIR      Current Medications: Current Meds  Medication Sig  . esomeprazole (NEXIUM) 20 MG capsule Take 20 mg by mouth 2 (two) times daily before a meal.  . famotidine (PEPCID) 20 MG tablet TAKE 1 TABLET BY MOUTH EVERY DAY  . vitamin B-12 (CYANOCOBALAMIN) 100 MCG tablet Take 100 mcg by mouth daily.     Allergies:   Other   Social History   Socioeconomic History  . Marital status: Married    Spouse name: Not on file  . Number of children: Not on file  . Years of education: Not on file  . Highest education level: Not on file  Occupational History  . Not on file  Social Needs  . Financial resource strain: Not on file  . Food insecurity    Worry: Not on file    Inability: Not on file  . Transportation needs    Medical: Not on file    Non-medical: Not on file  Tobacco Use  . Smoking status: Never Smoker  .  Smokeless tobacco: Never Used  Substance and Sexual Activity  . Alcohol use: No  . Drug use: No  . Sexual activity: Not on file  Lifestyle  . Physical activity    Days per week: Not on file    Minutes per session: Not on file  . Stress: Not on file  Relationships  . Social Herbalist on phone: Not on file    Gets together: Not on file    Attends religious service: Not on file    Active member of club or organization: Not on file    Attends meetings of clubs or organizations: Not on file    Relationship status: Not on file  Other Topics  Concern  . Not on file  Social History Narrative  . Not on file     Family History: The patient's family history includes Coronary artery disease in her brother; Heart disease in her mother; Stroke in her sister.  ROS:   Review of Systems  Constitution: Positive for malaise/fatigue and weight gain.  HENT: Negative.   Eyes: Negative.   Cardiovascular: Positive for chest pain, dyspnea on exertion and palpitations.  Respiratory: Positive for shortness of breath.   Endocrine: Negative.   Hematologic/Lymphatic: Negative.   Skin: Negative.   Musculoskeletal: Positive for joint pain.  Gastrointestinal: Positive for heartburn.  Genitourinary: Negative.   Neurological: Negative.   Psychiatric/Behavioral: Negative.   Allergic/Immunologic: Negative.    Please see the history of present illness.     All other systems reviewed and are negative.  EKGs/Labs/Other Studies Reviewed:    The following studies were reviewed today:   EKG:  EKG is  ordered today.  The ekg ordered today is personally reviewed and demonstrates sinus rhythm and is normal  Recent Labs: 06/11/2018: Magnesium 2.1 03/13/2019: ALT 10; TSH 1.32 05/10/2019: BUN 14; Creatinine, Ser 0.80; Hemoglobin 13.1; Platelets 243; Potassium 4.1; Sodium 139  Recent Lipid Panel No results found for: CHOL, TRIG, HDL, CHOLHDL, VLDL, LDLCALC, LDLDIRECT  Physical Exam:    VS:  BP  114/78 (BP Location: Left Arm, Patient Position: Sitting, Cuff Size: Normal)   Pulse 63   Temp 98.1 F (36.7 C)   Ht 5\' 6"  (1.676 m)   Wt 175 lb 6.4 oz (79.6 kg)   SpO2 97%   BMI 28.31 kg/m     Wt Readings from Last 3 Encounters:  05/14/19 175 lb 6.4 oz (79.6 kg)  05/10/19 170 lb (77.1 kg)  03/13/19 171 lb 6.4 oz (77.7 kg)     GEN: Anxious well nourished, well developed in no acute distress HEENT: Normal NECK: No JVD; No carotid bruits LYMPHATICS: No lymphadenopathy CARDIAC: RRR, no murmurs, rubs, gallops RESPIRATORY:  Clear to auscultation without rales, wheezing or rhonchi  ABDOMEN: Soft, non-tender, non-distended MUSCULOSKELETAL:  No edema; No deformity  SKIN: Warm and dry NEUROLOGIC:  Alert and oriented x 3 PSYCHIATRIC:  Normal affect     Signed, Shirlee More, MD  05/14/2019 4:55 PM    [facility]

## 2019-05-13 NOTE — Telephone Encounter (Signed)
Pt has an appointment with Cardio tomorrow.

## 2019-05-14 ENCOUNTER — Other Ambulatory Visit: Payer: Self-pay

## 2019-05-14 ENCOUNTER — Ambulatory Visit (INDEPENDENT_AMBULATORY_CARE_PROVIDER_SITE_OTHER): Payer: No Typology Code available for payment source | Admitting: Cardiology

## 2019-05-14 ENCOUNTER — Encounter: Payer: Self-pay | Admitting: Cardiology

## 2019-05-14 VITALS — BP 114/78 | HR 63 | Temp 98.1°F | Ht 66.0 in | Wt 175.4 lb

## 2019-05-14 DIAGNOSIS — Z01812 Encounter for preprocedural laboratory examination: Secondary | ICD-10-CM

## 2019-05-14 DIAGNOSIS — R0789 Other chest pain: Secondary | ICD-10-CM

## 2019-05-14 DIAGNOSIS — R079 Chest pain, unspecified: Secondary | ICD-10-CM | POA: Diagnosis not present

## 2019-05-14 MED ORDER — METOPROLOL TARTRATE 50 MG PO TABS: 100.0000 mg | ORAL_TABLET | Freq: Once | ORAL | 0 refills | Status: DC

## 2019-05-14 MED FILL — FAMOTIDINE 20 MG TABLET: 20 | 30 days supply | Qty: 30 | Fill #1

## 2019-05-14 MED FILL — METOPROLOL TARTRATE 50 MG T: 50 | 1 days supply | Qty: 2 | Fill #0

## 2019-05-14 NOTE — Patient Instructions (Addendum)
Medication Instructions:  Your physician recommends that you continue on your current medications as directed. Please refer to the Current Medication list given to you today.  If you need a refill on your cardiac medications before your next appointment, please call your pharmacy.   Lab work: Your physician recommends that you return for lab work within 3-7 days before your cardiac CTA: BMP. Please return to our [facility] or [place] office for lab work, no appointment needed. No need to fast beforehand.   If you have labs (blood work) drawn today and your tests are completely normal, you will receive your results only by: Marland Kitchen MyChart Message (if you have MyChart) OR . A paper copy in the mail If you have any lab test that is abnormal or we need to change your treatment, we will call you to review the results.  Testing/Procedures: You had an EKG today.   Your physician has requested that you have cardiac CT. Cardiac computed tomography (CT) is a painless test that uses an x-ray machine to take clear, detailed pictures of your heart. For further information please visit [email or web address]tn. Please follow instruction sheet as given.  [facility] [address] [place], [place] [address] [phone]  Please arrive at the [facility] main entrance of [facility] 30-45 minutes prior to test start time. Proceed to the [facility] Radiology Department (first floor) to check-in and test prep.  Please follow these instructions carefully (unless otherwise directed):   On the Night Before the Test: . Be sure to Drink plenty of water. . Do not consume any caffeinated/decaffeinated beverages or chocolate 12 hours prior to your test. . Do not take any antihistamines 12 hours prior to your test.   On the Day of the Test: . Drink plenty of water. Do not drink any water within one hour of the test. . Do not eat any food 4 hours prior to the test. . You may take your regular  medications prior to the test.  . Take metoprolol (Lopressor) two hours prior to test. . FEMALES- please wear underwire-free bra if available                 -If HR is less than 55 BPM- No Beta Blocker                -IF HR is greater than 55 BPM and patient is less than or equal to 55 yrs old Lopressor 100mg  x1.       After the Test: . Drink plenty of water. . After receiving IV contrast, you may experience a mild flushed feeling. This is normal. . On occasion, you may experience a mild rash up to 24 hours after the test. This is not dangerous. If this occurs, you can take Benadryl 25 mg and increase your fluid intake. . If you experience trouble breathing, this can be serious. If it is severe call 911 IMMEDIATELY. If it is mild, please call our office.   Please contact the cardiac imaging nurse navigator should you have any questions/concerns Marchia Bond, RN Navigator Cardiac Imaging [facility] [phone] Office  [phone] Cell    Follow-Up: At [facility], you and your health needs are our priority.  As part of our continuing mission to provide you with exceptional heart care, we have created designated Provider Care Teams.  These Care Teams include your primary Cardiologist (physician) and Advanced Practice Providers (APPs -  Physician Assistants and Nurse  Practitioners) who all work together to provide you with the care you need, when you need it. You will need a follow up appointment in 6 weeks.        Cardiac CT Angiogram  A cardiac CT angiogram is a procedure to look at the heart and the area around the heart. It may be done to help find the cause of chest pains or other symptoms of heart disease. During this procedure, a large X-ray machine, called a CT scanner, takes detailed pictures of the heart and the surrounding area after a dye (contrast material) has been injected into blood vessels in the area. The procedure is also sometimes  called a coronary CT angiogram, coronary artery scanning, or CTA. A cardiac CT angiogram allows the health care provider to see how well blood is flowing to and from the heart. The health care provider will be able to see if there are any problems, such as:  Blockage or narrowing of the coronary arteries in the heart.  Fluid around the heart.  Signs of weakness or disease in the muscles, valves, and tissues of the heart. Tell a health care provider about:  Any allergies you have. This is especially important if you have had a previous allergic reaction to contrast dye.  All medicines you are taking, including vitamins, herbs, eye drops, creams, and over-the-counter medicines.  Any blood disorders you have.  Any surgeries you have had.  Any medical conditions you have.  Whether you are pregnant or may be pregnant.  Any anxiety disorders, chronic pain, or other conditions you have that may increase your stress or prevent you from lying still. What are the risks? Generally, this is a safe procedure. However, problems may occur, including:  Bleeding.  Infection.  Allergic reactions to medicines or dyes.  Damage to other structures or organs.  Kidney damage from the dye or contrast that is used.  Increased risk of cancer from radiation exposure. This risk is low. Talk with your health care provider about: ? The risks and benefits of testing. ? How you can receive the lowest dose of radiation. What happens before the procedure?  Wear comfortable clothing and remove any jewelry, glasses, dentures, and hearing aids.  Follow instructions from your health care provider about eating and drinking. This may include: ? For 12 hours before the test - avoid caffeine. This includes tea, coffee, soda, energy drinks, and diet pills. Drink plenty of water or other fluids that do not have caffeine in them. Being well-hydrated can prevent complications. ? For 4-6 hours before the test - stop  eating and drinking. The contrast dye can cause nausea, but this is less likely if your stomach is empty.  Ask your health care provider about changing or stopping your regular medicines. This is especially important if you are taking diabetes medicines, blood thinners, or medicines to treat erectile dysfunction. What happens during the procedure?  Hair on your chest may need to be removed so that small sticky patches called electrodes can be placed on your chest. These will transmit information that helps to monitor your heart during the test.  An IV tube will be inserted into one of your veins.  You might be given a medicine to control your heart rate during the test. This will help to ensure that good images are obtained.  You will be asked to lie on an exam table. This table will slide in and out of the CT machine during the procedure.  Contrast  dye will be injected into the IV tube. You might feel warm, or you may get a metallic taste in your mouth.  You will be given a medicine (nitroglycerin) to relax (dilate) the arteries in your heart.  The table that you are lying on will move into the CT machine tunnel for the scan.  The person running the machine will give you instructions while the scans are being done. You may be asked to: ? Keep your arms above your head. ? Hold your breath. ? Stay very still, even if the table is moving.  When the scanning is complete, you will be moved out of the machine.  The IV tube will be removed. The procedure may vary among health care providers and hospitals. What happens after the procedure?  You might feel warm, or you may get a metallic taste in your mouth from the contrast dye.  You may have a headache from the nitroglycerin.  After the procedure, drink water or other fluids to wash (flush) the contrast material out of your body.  Contact a health care provider if you have any symptoms of allergy to the contrast. These symptoms include:  ? Shortness of breath. ? Rash or hives. ? A racing heartbeat.  Most people can return to their normal activities right after the procedure. Ask your health care provider what activities are safe for you.  It is up to you to get the results of your procedure. Ask your health care provider, or the department that is doing the procedure, when your results will be ready. Summary  A cardiac CT angiogram is a procedure to look at the heart and the area around the heart. It may be done to help find the cause of chest pains or other symptoms of heart disease.  During this procedure, a large X-ray machine, called a CT scanner, takes detailed pictures of the heart and the surrounding area after a dye (contrast material) has been injected into blood vessels in the area.  Ask your health care provider about changing or stopping your regular medicines before the procedure. This is especially important if you are taking diabetes medicines, blood thinners, or medicines to treat erectile dysfunction.  After the procedure, drink water or other fluids to wash (flush) the contrast material out of your body. This information is not intended to replace advice given to you by your health care provider. Make sure you discuss any questions you have with your health care provider. Document Released: 08/24/2008 Document Revised: 08/24/2017 Document Reviewed: 07/31/2016 Elsevier Patient Education  2020 [employer].

## 2019-05-19 ENCOUNTER — Telehealth (HOSPITAL_COMMUNITY): Payer: Self-pay | Admitting: Emergency Medicine

## 2019-05-19 NOTE — Telephone Encounter (Signed)
Reaching out to patient to offer assistance regarding upcoming cardiac imaging study; pt verbalizes understanding of appt date/time, parking situation and where to check in, pre-test NPO status and medications ordered, and verified current allergies; name and call back number provided for further questions should they arise Markhi Kleckner RN Navigator Cardiac Imaging [facility] [phone] office [phone] cell 

## 2019-05-21 ENCOUNTER — Ambulatory Visit (HOSPITAL_COMMUNITY): Payer: No Typology Code available for payment source

## 2019-05-21 ENCOUNTER — Other Ambulatory Visit: Payer: Self-pay

## 2019-05-21 ENCOUNTER — Encounter: Payer: No Typology Code available for payment source | Admitting: *Deleted

## 2019-05-21 ENCOUNTER — Ambulatory Visit (HOSPITAL_COMMUNITY)
Admission: RE | Admit: 2019-05-21 | Discharge: 2019-05-21 | Disposition: A | Discharge status: Home / Self Care | Payer: No Typology Code available for payment source | Source: Ambulatory Visit | Attending: Cardiology | Admitting: Cardiology

## 2019-05-21 DIAGNOSIS — Z006 Encounter for examination for normal comparison and control in clinical research program: Secondary | ICD-10-CM

## 2019-05-21 DIAGNOSIS — R079 Chest pain, unspecified: Secondary | ICD-10-CM | POA: Insufficient documentation

## 2019-05-21 MED ORDER — NITROGLYCERIN 0.4 MG SL SUBL
SUBLINGUAL_TABLET | SUBLINGUAL | Status: AC
  Filled 2019-05-21: qty 1

## 2019-05-21 MED ORDER — NITROGLYCERIN 0.4 MG SL SUBL
SUBLINGUAL_TABLET | SUBLINGUAL | Status: AC
  Administered 2019-05-21: 16:00:00 0.8 mg via SUBLINGUAL
  Filled 2019-05-21: qty 2

## 2019-05-21 MED ORDER — IOHEXOL 350 MG/ML SOLN
80.0000 mL | Freq: Once | INTRAVENOUS | Status: AC | PRN
  Administered 2019-05-21: 16:00:00 80 mL via INTRAVENOUS

## 2019-05-21 MED ORDER — NITROGLYCERIN 0.4 MG SL SUBL
0.8000 mg | SUBLINGUAL_TABLET | Freq: Once | SUBLINGUAL | Status: AC
  Administered 2019-05-21: 16:00:00 0.8 mg via SUBLINGUAL
  Filled 2019-05-21: qty 25

## 2019-05-21 NOTE — Research (Signed)
CADFEM Informed Consent                  Subject Name:   Rita Miranda   Subject met inclusion and exclusion criteria.  The informed consent form, study requirements and expectations were reviewed with the subject and questions and concerns were addressed prior to the signing of the consent form.  The subject verbalized understanding of the trial requirements.  The subject agreed to participate in the CADFEM trial and signed the informed consent.  The informed consent was obtained prior to performance of any protocol-specific procedures for the subject.  A copy of the signed informed consent was given to the subject and a copy was placed in the subject's medical record.   [place] Boyett, Research Assistant  05/21/2019 14:03 p.m.

## 2019-05-26 NOTE — Progress Notes (Signed)
Cardiology Office Note:    Date:  05/27/2019   ID:  Rita Miranda, DOB 1966/01/31, MRN [id number]  PCP:  Mackie Pai, PA-C  Cardiologist:  Shirlee More, MD    Referring MD: Mackie Pai, PA-C    ASSESSMENT:    No diagnosis found. PLAN:    In order of problems listed above:  1. Sinus bradycardia applied 3 days ago monitor I do not think she will require pacemaker modalities and hopefully this will give [place] reassurance and avoid rate limiting medications 2. Normal cardiac CTA at this time I do no further ischemia evaluation   Next appointment: 3 weeks   Medication Adjustments/Labs and Tests Ordered: Current medicines are reviewed at length with the patient today.  Concerns regarding medicines are outlined above.  No orders of the defined types were placed in this encounter.  No orders of the defined types were placed in this encounter.   Chief Complaint  Patient presents with  . OTHER    C/o chest pain , sob and weight gain. Meds reviewed verbally with pt.    History of Present Illness:    Rita Miranda is a 53 y.o. female with a hx of chest pain last seen 05/14/2019 and referred for cardiac CTA.. Calcium score was 0 and showed normal coronary arteries and no extracardiac abnormality on the CT scan.   Compliance with diet, lifestyle and medications: Yes  She continues to have exercise intolerance and feels that she has heart rates are too low and at times she gets recordings at home less than 40 bpm.  For further evaluation she will wear a 3-day ZIO monitor I have asked her to continue her exercise program she is on no rate slowing medications which can cause bradycardia.  No edema orthopnea chest pain. Past Medical History:  Diagnosis Date  . Cancer ([place])    uterine  . Chest pain in adult 11/11/2018  . GERD (gastroesophageal reflux disease)   . Sinus bradycardia 03/17/2019    Past Surgical History:  Procedure Laterality Date  . ABDOMINAL HYSTERECTOMY    .  LAPAROSCOPIC GASTRIC SLEEVE RESECTION WITH HIATAL HERNIA REPAIR      Current Medications: Current Meds  Medication Sig  . esomeprazole (NEXIUM) 20 MG capsule Take 20 mg by mouth 2 (two) times daily before a meal.  . famotidine (PEPCID) 20 MG tablet TAKE 1 TABLET BY MOUTH EVERY DAY  . vitamin B-12 (CYANOCOBALAMIN) 100 MCG tablet Take 100 mcg by mouth daily.     Allergies:   Other   Social History   Socioeconomic History  . Marital status: Married    Spouse name: Not on file  . Number of children: Not on file  . Years of education: Not on file  . Highest education level: Not on file  Occupational History  . Not on file  Social Needs  . Financial resource strain: Not on file  . Food insecurity    Worry: Not on file    Inability: Not on file  . Transportation needs    Medical: Not on file    Non-medical: Not on file  Tobacco Use  . Smoking status: Never Smoker  . Smokeless tobacco: Never Used  Substance and Sexual Activity  . Alcohol use: No  . Drug use: No  . Sexual activity: Not on file  Lifestyle  . Physical activity    Days per week: Not on file    Minutes per session: Not on file  . Stress: Not on file  Relationships  . Social Herbalist on phone: Not on file    Gets together: Not on file    Attends religious service: Not on file    Active member of club or organization: Not on file    Attends meetings of clubs or organizations: Not on file    Relationship status: Not on file  Other Topics Concern  . Not on file  Social History Narrative  . Not on file     Family History: The patient's family history includes Coronary artery disease in her brother; Heart disease in her mother; Stroke in her sister. ROS:   Please see the history of present illness.    All other systems reviewed and are negative.  EKGs/Labs/Other Studies Reviewed:    The following studies were reviewed today:  EKG:  EKG ordered today and personally reviewed.  The ekg ordered  today demonstrates sinus rhythm and is normal  Recent Labs: 06/11/2018: Magnesium 2.1 03/13/2019: ALT 10; TSH 1.32 05/10/2019: BUN 14; Creatinine, Ser 0.80; Hemoglobin 13.1; Platelets 243; Potassium 4.1; Sodium 139  Recent Lipid Panel No results found for: CHOL, TRIG, HDL, CHOLHDL, VLDL, LDLCALC, LDLDIRECT  Physical Exam:    VS:  BP 106/62 (BP Location: Right Arm, Patient Position: Sitting, Cuff Size: Large)   Pulse 60   Ht 5\' 7"  (1.702 m)   Wt 176 lb 4 oz (79.9 kg)   SpO2 98%   BMI 27.60 kg/m     Wt Readings from Last 3 Encounters:  05/27/19 176 lb 4 oz (79.9 kg)  05/14/19 175 lb 6.4 oz (79.6 kg)  05/10/19 170 lb (77.1 kg)     GEN:  Well nourished, well developed in no acute distress HEENT: Normal NECK: No JVD; No carotid bruits LYMPHATICS: No lymphadenopathy CARDIAC: RRR, no murmurs, rubs, gallops RESPIRATORY:  Clear to auscultation without rales, wheezing or rhonchi  ABDOMEN: Soft, non-tender, non-distended MUSCULOSKELETAL:  No edema; No deformity  SKIN: Warm and dry NEUROLOGIC:  Alert and oriented x 3 PSYCHIATRIC:  Normal affect    Signed, Shirlee More, MD  05/27/2019 12:02 PM    [place]

## 2019-05-27 ENCOUNTER — Encounter: Payer: Self-pay | Admitting: Cardiology

## 2019-05-27 ENCOUNTER — Ambulatory Visit (INDEPENDENT_AMBULATORY_CARE_PROVIDER_SITE_OTHER): Payer: No Typology Code available for payment source | Admitting: Cardiology

## 2019-05-27 ENCOUNTER — Other Ambulatory Visit: Payer: Self-pay

## 2019-05-27 ENCOUNTER — Encounter: Payer: Self-pay | Admitting: *Deleted

## 2019-05-27 VITALS — BP 106/62 | HR 60 | Ht 67.0 in | Wt 176.2 lb

## 2019-05-27 DIAGNOSIS — R001 Bradycardia, unspecified: Secondary | ICD-10-CM | POA: Diagnosis not present

## 2019-05-27 DIAGNOSIS — R002 Palpitations: Secondary | ICD-10-CM | POA: Insufficient documentation

## 2019-05-27 NOTE — Patient Instructions (Signed)
Medication Instructions:  Your physician recommends that you continue on your current medications as directed. Please refer to the Current Medication list given to you today.  If you need a refill on your cardiac medications before your next appointment, please call your pharmacy.   Lab work: None  If you have labs (blood work) drawn today and your tests are completely normal, you will receive your results only by: Marland Kitchen MyChart Message (if you have MyChart) OR . A paper copy in the mail If you have any lab test that is abnormal or we need to change your treatment, we will call you to review the results.  Testing/Procedures: You had an EKG today.   Your physician has recommended that you wear a ZIO monitor. ZIO monitors are medical devices that record the heart's electrical activity. Doctors most often use these monitors to diagnose arrhythmias. Arrhythmias are problems with the speed or rhythm of the heartbeat. The monitor is a small, portable device. You can wear one while you do your normal daily activities. This is usually used to diagnose what is causing palpitations/syncope (passing out). Wear for 3 days.   Follow-Up: At [facility], you and your health needs are our priority.  As part of our continuing mission to provide you with exceptional heart care, we have created designated Provider Care Teams.  These Care Teams include your primary Cardiologist (physician) and Advanced Practice Providers (APPs -  Physician Assistants and Nurse Practitioners) who all work together to provide you with the care you need, when you need it. You will need a follow up appointment in 3 weeks.

## 2019-06-04 ENCOUNTER — Ambulatory Visit (INDEPENDENT_AMBULATORY_CARE_PROVIDER_SITE_OTHER): Payer: No Typology Code available for payment source

## 2019-06-04 DIAGNOSIS — R001 Bradycardia, unspecified: Secondary | ICD-10-CM

## 2019-06-04 DIAGNOSIS — R002 Palpitations: Secondary | ICD-10-CM | POA: Diagnosis not present

## 2019-06-12 MED FILL — FAMOTIDINE 20 MG TABLET: 20 | 30 days supply | Qty: 30 | Fill #2

## 2019-06-17 ENCOUNTER — Ambulatory Visit: Payer: No Typology Code available for payment source | Admitting: Family

## 2019-06-25 ENCOUNTER — Ambulatory Visit: Payer: No Typology Code available for payment source | Admitting: Cardiology

## 2019-12-02 ENCOUNTER — Ambulatory Visit: Payer: No Typology Code available for payment source | Admitting: Cardiology

## 2019-12-17 ENCOUNTER — Ambulatory Visit: Payer: No Typology Code available for payment source | Admitting: Medical

## 2019-12-19 ENCOUNTER — Encounter (HOSPITAL_BASED_OUTPATIENT_CLINIC_OR_DEPARTMENT_OTHER): Payer: Self-pay | Admitting: *Deleted

## 2019-12-19 ENCOUNTER — Other Ambulatory Visit: Payer: Self-pay

## 2019-12-19 ENCOUNTER — Emergency Department (HOSPITAL_BASED_OUTPATIENT_CLINIC_OR_DEPARTMENT_OTHER): Payer: BC Managed Care – PPO

## 2019-12-19 ENCOUNTER — Emergency Department (HOSPITAL_BASED_OUTPATIENT_CLINIC_OR_DEPARTMENT_OTHER)
Admission: EM | Admit: 2019-12-19 | Discharge: 2019-12-19 | Disposition: A | Discharge status: Home / Self Care | Payer: BC Managed Care – PPO | Attending: Emergency Medicine | Admitting: Emergency Medicine

## 2019-12-19 DIAGNOSIS — Z79899 Other long term (current) drug therapy: Secondary | ICD-10-CM | POA: Insufficient documentation

## 2019-12-19 DIAGNOSIS — R519 Headache, unspecified: Secondary | ICD-10-CM | POA: Diagnosis not present

## 2019-12-19 DIAGNOSIS — R0789 Other chest pain: Secondary | ICD-10-CM | POA: Diagnosis not present

## 2019-12-19 DIAGNOSIS — R42 Dizziness and giddiness: Secondary | ICD-10-CM

## 2019-12-19 DIAGNOSIS — R110 Nausea: Secondary | ICD-10-CM | POA: Diagnosis not present

## 2019-12-19 DIAGNOSIS — Z8542 Personal history of malignant neoplasm of other parts of uterus: Secondary | ICD-10-CM | POA: Insufficient documentation

## 2019-12-19 DIAGNOSIS — S0990XA Unspecified injury of head, initial encounter: Secondary | ICD-10-CM | POA: Diagnosis not present

## 2019-12-19 LAB — CBC
HCT: 39.8 % (ref 36.0–46.0)
Hemoglobin: 12.8 g/dL (ref 12.0–15.0)
MCH: 31.1 pg (ref 26.0–34.0)
MCHC: 32.2 g/dL (ref 30.0–36.0)
MCV: 96.6 fL (ref 80.0–100.0)
Platelets: 211 10*3/uL (ref 150–400)
RBC: 4.12 MIL/uL (ref 3.87–5.11)
RDW: 13 % (ref 11.5–15.5)
WBC: 7.9 10*3/uL (ref 4.0–10.5)
nRBC: 0 % (ref 0.0–0.2)

## 2019-12-19 LAB — BASIC METABOLIC PANEL
Anion gap: 9 (ref 5–15)
BUN: 10 mg/dL (ref 6–20)
CO2: 22 mmol/L (ref 22–32)
Calcium: 8.4 mg/dL — ABNORMAL LOW (ref 8.9–10.3)
Chloride: 105 mmol/L (ref 98–111)
Creatinine, Ser: 0.67 mg/dL (ref 0.44–1.00)
GFR calc Af Amer: >60 mL/min (ref >60)
GFR calc non Af Amer: >60 mL/min (ref >60)
Glucose, Bld: 104 mg/dL — ABNORMAL HIGH (ref 70–99)
Potassium: 3.6 mmol/L (ref 3.5–5.1)
Sodium: 136 mmol/L (ref 135–145)

## 2019-12-19 MED ORDER — IOHEXOL 350 MG/ML SOLN: 100.0000 mL | Freq: Once | INTRAVENOUS | Status: DC | PRN

## 2019-12-19 MED ORDER — PROCHLORPERAZINE EDISYLATE 10 MG/2ML IJ SOLN
10.0000 mg | Freq: Once | INTRAMUSCULAR | Status: AC
  Administered 2019-12-19: 10 mg via INTRAVENOUS
  Filled 2019-12-19: qty 2

## 2019-12-19 MED ORDER — ETODOLAC 300 MG PO CAPS: 300.0000 mg | ORAL_CAPSULE | Freq: Three times a day (TID) | ORAL | 0 refills | Status: DC | PRN

## 2019-12-19 MED ORDER — DIPHENHYDRAMINE HCL 50 MG/ML IJ SOLN
12.5000 mg | Freq: Once | INTRAMUSCULAR | Status: AC
  Administered 2019-12-19: 12.5 mg via INTRAVENOUS
  Filled 2019-12-19: qty 1

## 2019-12-19 MED FILL — ETODOLAC 300 MG CAPSULE: 300 | 7 days supply | Qty: 21 | Fill #0

## 2019-12-19 NOTE — Discharge Instructions (Signed)
Take the medications as needed for your headache, follow-up with your primary care doctor.  Return to emergency room if you have worsening symptoms

## 2019-12-19 NOTE — ED Triage Notes (Signed)
Ha x 2 days woke this w dizziness and off balance,  Nausea   Fell yesterday [place] rt arm  No head inj  Friend gave a "nerve blocker" po and arm pain went away  states has been getting ha off and on x 2 weeks

## 2019-12-19 NOTE — ED Provider Notes (Signed)
[facility] EMERGENCY DEPARTMENT Provider Note   CSN: [id number] Arrival date & time: 12/19/19  [id number]     History Chief Complaint  Patient presents with  . Dizziness    Rita Miranda is a 54 y.o. female.  HPI   Patient presents to the ED for evaluation of headache and dizziness.  Patient states she has had gradual onset of symptoms that started about 2 days ago.  Headache has been increasing in severity despite taking over-the-counter medications.  Patient states the headache seems to be increasing in severity.  She also has been feeling dizzy and off balance.  She has had some nausea.  She denies any specific room spinning sensation.  She did have a fall yesterday but denies any serious injuries.  Patient denies any focal numbness or weakness.  No trouble with her speech or vision.  Past Medical History:  Diagnosis Date  . Cancer ([place])    uterine  . Chest pain in adult 11/11/2018  . GERD (gastroesophageal reflux disease)   . Sinus bradycardia 03/17/2019    Patient Active Problem List   Diagnosis Date Noted  . Palpitations 05/27/2019  . Sinus bradycardia 03/17/2019  . Chest pain in adult 11/11/2018    Past Surgical History:  Procedure Laterality Date  . ABDOMINAL HYSTERECTOMY    . LAPAROSCOPIC GASTRIC SLEEVE RESECTION WITH HIATAL HERNIA REPAIR       OB History   No obstetric history on file.     Family History  Problem Relation Age of Onset  . Heart disease Mother   . Coronary artery disease Brother   . Stroke Sister     Social History   Tobacco Use  . Smoking status: Never Smoker  . Smokeless tobacco: Never Used  Substance Use Topics  . Alcohol use: No  . Drug use: No    Home Medications Prior to Admission medications   Medication Sig Start Date End Date Taking? Authorizing Provider  esomeprazole (NEXIUM) 20 MG capsule Take 20 mg by mouth 2 (two) times daily before a meal.    [provider]  etodolac (LODINE) 300 MG capsule Take 1  capsule (300 mg total) by mouth 3 (three) times daily as needed. 12/19/19   Dorie Rank, MD  famotidine (PEPCID) 20 MG tablet TAKE 1 TABLET BY MOUTH EVERY DAY 04/07/19   Saguier, Percell Miller, PA-C  vitamin B-12 (CYANOCOBALAMIN) 100 MCG tablet Take 100 mcg by mouth daily.    [provider]    Allergies    Other  Review of Systems   Review of Systems  All other systems reviewed and are negative.   Physical Exam Updated Vital Signs BP (!) 95/54 (BP Location: Right Arm)   Pulse 95   Temp 99 F (37.2 C) (Oral)   Resp 18   Ht 1.727 m (5\' 8" )   Wt 81.6 kg   SpO2 95%   BMI 27.37 kg/m   Physical Exam Vitals and nursing note reviewed.  Constitutional:      General: She is not in acute distress.    Appearance: She is well-developed.  HENT:     Head: Normocephalic and atraumatic.     Right Ear: External ear normal.     Left Ear: External ear normal.  Eyes:     General: No scleral icterus.       Right eye: No discharge.        Left eye: No discharge.     Conjunctiva/sclera: Conjunctivae normal.  Neck:  Trachea: No tracheal deviation.  Cardiovascular:     Rate and Rhythm: Normal rate and regular rhythm.  Pulmonary:     Effort: Pulmonary effort is normal. No respiratory distress.     Breath sounds: Normal breath sounds. No stridor. No wheezing or rales.  Abdominal:     General: Bowel sounds are normal. There is no distension.     Palpations: Abdomen is soft.     Tenderness: There is no abdominal tenderness. There is no guarding or rebound.  Musculoskeletal:        General: No tenderness.     Cervical back: Neck supple.  Skin:    General: Skin is warm and dry.     Findings: No rash.  Neurological:     Mental Status: She is alert and oriented to person, place, and time.     Cranial Nerves: No cranial nerve deficit (No facial droop, extraocular movements intact, tongue midline ).     Sensory: No sensory deficit.     Motor: No abnormal muscle tone or seizure activity.       Coordination: Coordination normal.     Comments: No pronator drift bilateral upper extrem, able to hold both legs off bed for 5 seconds, sensation intact in all extremities, no visual field cuts, no left or right sided neglect, normal finger-nose exam bilaterally, no nystagmus noted      ED Results / Procedures / Treatments   Labs (all labs ordered are listed, but only abnormal results are displayed) Labs Reviewed  BASIC METABOLIC PANEL - Abnormal; Notable for the following components:      Result Value   Glucose, Bld 104 (*)    Calcium 8.4 (*)    All other components within normal limits  CBC    EKG None  Radiology CT HEAD WO CONTRAST  Result Date: 12/19/2019 CLINICAL DATA:  Headaches and dizziness following fall EXAM: CT HEAD WITHOUT CONTRAST TECHNIQUE: Contiguous axial images were obtained from the base of the skull through the vertex without intravenous contrast. COMPARISON:  None. FINDINGS: Brain: No evidence of acute infarction, hemorrhage, hydrocephalus, extra-axial collection or mass lesion/mass effect. Vascular: No hyperdense vessel or unexpected calcification. Skull: Normal. Negative for fracture or focal lesion. Sinuses/Orbits: No acute finding. Other: None. IMPRESSION: No acute intracranial abnormality noted. Electronically Signed   By: Inez Catalina M.D.   On: 12/19/2019 10:18    Procedures Procedures (including critical care time)  Medications Ordered in ED Medications  iohexol (OMNIPAQUE) 350 MG/ML injection 100 mL (has no administration in time range)  prochlorperazine (COMPAZINE) injection 10 mg (10 mg Intravenous Given 12/19/19 0914)  diphenhydrAMINE (BENADRYL) injection 12.5 mg (12.5 mg Intravenous Given 12/19/19 0914)    ED Course  I have reviewed the triage vital signs and the nursing notes.  Pertinent labs & imaging results that were available during my care of the patient were reviewed by me and considered in my medical decision making (see chart for  details).  Clinical Course as of Dec 19 1106  Fri Dec 19, 2019  1003 Notified that pt does not have adequate IV access for angio.  Pt refuses further iv attempt.  Will proceed with head ct non con   [JK]  1107 Patient symptoms resolved after her migraine cocktail.  CT scan and labs are reassuring.  Patient states she is feeling better.   [JK]    Clinical Course User Index [JK] Dorie Rank, MD   MDM Rules/Calculators/A&P  Patient presented with complaints of headache and dizziness.  No discrete neurologic deficits on exam.  Considered CT angiogram considering her neurologic complaints initially headache but unable to obtain an adequate IV.  Patient improving with treatment.  Her head CT does not show any acute abnormality.  Discussed possibility of transfer to another facility for MRI for further evaluation but the patient is feeling better.  She does not feel that is necessary and overall I have a low suspicion of occult stroke or vascular abnormality.  Possible migraine type headache.  At this time there does not appear to be any evidence of an acute emergency medical condition and the patient appears stable for discharge with appropriate outpatient follow up.  Final Clinical Impression(s) / ED Diagnoses Final diagnoses:  Acute nonintractable headache, unspecified headache type  Dizziness    Rx / DC Orders ED Discharge Orders         Ordered    etodolac (LODINE) 300 MG capsule  3 times daily PRN    Note to Pharmacy: As needed for pain   12/19/19 1106           Dorie Rank, MD 12/19/19 1108

## 2020-01-05 DIAGNOSIS — Z0189 Encounter for other specified special examinations: Secondary | ICD-10-CM | POA: Diagnosis not present

## 2020-01-05 DIAGNOSIS — Z13220 Encounter for screening for lipoid disorders: Secondary | ICD-10-CM | POA: Diagnosis not present

## 2020-01-06 DIAGNOSIS — Z0000 Encounter for general adult medical examination without abnormal findings: Secondary | ICD-10-CM | POA: Diagnosis not present

## 2020-06-15 ENCOUNTER — Ambulatory Visit: Payer: No Typology Code available for payment source | Admitting: Medical

## 2020-06-15 DIAGNOSIS — Z0289 Encounter for other administrative examinations: Secondary | ICD-10-CM

## 2020-06-21 ENCOUNTER — Ambulatory Visit: Payer: BC Managed Care – PPO | Admitting: Medical

## 2020-06-21 ENCOUNTER — Encounter: Payer: Self-pay | Admitting: Medical

## 2020-06-21 ENCOUNTER — Other Ambulatory Visit: Payer: Self-pay

## 2020-06-21 VITALS — BP 100/69 | HR 74 | Temp 98.3°F | Resp 18 | Ht 68.0 in | Wt 188.0 lb

## 2020-06-21 DIAGNOSIS — R1013 Epigastric pain: Secondary | ICD-10-CM | POA: Diagnosis not present

## 2020-06-21 DIAGNOSIS — K219 Gastro-esophageal reflux disease without esophagitis: Secondary | ICD-10-CM

## 2020-06-21 DIAGNOSIS — Z9889 Other specified postprocedural states: Secondary | ICD-10-CM

## 2020-06-21 MED ORDER — FAMOTIDINE 20 MG PO TABS: 20.0000 mg | ORAL_TABLET | Freq: Two times a day (BID) | ORAL | 0 refills | Status: DC

## 2020-06-21 MED ORDER — SUCRALFATE 1 G PO TABS: ORAL_TABLET | ORAL | 0 refills | Status: DC

## 2020-06-21 MED FILL — SUCRALFATE 1 GM TABLET: 1 | 30 days supply | Qty: 90 | Fill #0

## 2020-06-21 MED FILL — FAMOTIDINE 20 MG TABS: 20 | 30 days supply | Qty: 60 | Fill #0

## 2020-06-21 NOTE — Patient Instructions (Signed)
You do have history of moderate to severe reflux type symptoms.  Worse over the past 2 weeks despite very strict diet and Nexium use.  All will prescribe famotidine add on medication.  If this does not improve her symptoms then start sucralfate as well.  Strict GERD/bland diet guidelines recommended.  Get scheduled for labs tomorrow to include CBC, CMP and amylase.  Also want you to go downstairs and talk with radiology department to get scheduled for abdomen ultrasound.  Went ahead and place referral to GI MD as he will probably need eventual EGD.  Also went ahead and made a referral to bariatric surgeon since he has history of gastric sleeve and have not seen surgeon for follow-up in 5 years.  Follow-up in 2 weeks or as needed.

## 2020-06-21 NOTE — Progress Notes (Signed)
Subjective:    Patient ID: Rita Miranda, female    DOB: [birth year], 54 y.o.   MRN: [id number]  HPI   Pt in with some recent severe heart burn. Pain worse when bending over. She states almost can't eat solid foods due to increased heart burn.  Can eat t soft foods such as apple sauce, jello, rice pudding and ice cream. But almost all other foods will cause reflux and dry cough.  Pt has tried nexium, famotadine and omeprazole. Only using nexium presenty.  Pt is belching a lot.  Pt has history of gastric sleeve.  Pt had surgeon who did procedure in [place] 5 years ago.   Heart burn flared past 2 weeks but on and off for 2 years.  Pt had colonoscopy 5 years ago.   Review of Systems  Constitutional: Negative for chills, fatigue and fever.  Respiratory: Negative for cough, chest tightness, shortness of breath and wheezing.   Cardiovascular: Negative for chest pain and palpitations.  Gastrointestinal: Positive for abdominal pain. Negative for abdominal distention, diarrhea, rectal pain and vomiting.  Genitourinary: Negative for dysuria, enuresis, flank pain and urgency.  Musculoskeletal: Negative for back pain.  Skin: Negative for rash.  Neurological: Negative for dizziness, weakness, numbness and headaches.  Hematological: Negative for adenopathy. Does not bruise/bleed easily.  Psychiatric/Behavioral: Negative for behavioral problems and confusion.    Past Medical History:  Diagnosis Date  . Cancer ([place])    uterine  . Chest pain in adult 11/11/2018  . GERD (gastroesophageal reflux disease)   . Sinus bradycardia 03/17/2019     Social History   Socioeconomic History  . Marital status: Married    Spouse name: Not on file  . Number of children: Not on file  . Years of education: Not on file  . Highest education level: Not on file  Occupational History  . Not on file  Tobacco Use  . Smoking status: Never Smoker  . Smokeless tobacco: Never Used  Vaping Use  . Vaping  Use: Never used  Substance and Sexual Activity  . Alcohol use: No  . Drug use: No  . Sexual activity: Not on file  Other Topics Concern  . Not on file  Social History Narrative  . Not on file   Social Determinants of Health   Financial Resource Strain:   . Difficulty of Paying Living Expenses: Not on file  Food Insecurity:   . Worried About Charity fundraiser in the Last Year: Not on file  . Ran Out of Food in the Last Year: Not on file  Transportation Needs:   . Lack of Transportation (Medical): Not on file  . Lack of Transportation (Non-Medical): Not on file  Physical Activity:   . Days of Exercise per Week: Not on file  . Minutes of Exercise per Session: Not on file  Stress:   . Feeling of Stress : Not on file  Social Connections:   . Frequency of Communication with Friends and Family: Not on file  . Frequency of Social Gatherings with Friends and Family: Not on file  . Attends Religious Services: Not on file  . Active Member of Clubs or Organizations: Not on file  . Attends Archivist Meetings: Not on file  . Marital Status: Not on file  Intimate Partner Violence:   . Fear of Current or Ex-Partner: Not on file  . Emotionally Abused: Not on file  . Physically Abused: Not on file  . Sexually Abused: Not  on file    Past Surgical History:  Procedure Laterality Date  . ABDOMINAL HYSTERECTOMY    . LAPAROSCOPIC GASTRIC SLEEVE RESECTION WITH HIATAL HERNIA REPAIR      Family History  Problem Relation Age of Onset  . Heart disease Mother   . Coronary artery disease Brother   . Stroke Sister     Allergies  Allergen Reactions  . Other Hives    seafood    Current Outpatient Medications on File Prior to Visit  Medication Sig Dispense Refill  . esomeprazole (NEXIUM) 20 MG capsule Take 20 mg by mouth 2 (two) times daily before a meal.    . etodolac (LODINE) 300 MG capsule Take 1 capsule (300 mg total) by mouth 3 (three) times daily as needed. (Patient not  taking: Reported on 06/21/2020) 21 capsule 0  . famotidine (PEPCID) 20 MG tablet TAKE 1 TABLET BY MOUTH EVERY DAY (Patient not taking: Reported on 06/21/2020) 30 tablet 0  . vitamin B-12 (CYANOCOBALAMIN) 100 MCG tablet Take 100 mcg by mouth daily.     No current facility-administered medications on file prior to visit.    BP 100/69   Pulse 74   Temp 98.3 F (36.8 C) (Oral)   Resp 18   Ht 5\' 8"  (1.727 m)   Wt 188 lb (85.3 kg)   SpO2 100%   BMI 28.59 kg/m      Objective:   Physical Exam  General Appearance- Not in acute distress.  HEENT Eyes- Scleraeral/Conjuntiva-bilat- Not Yellow. Mouth & Throat- Normal.  Chest and Lung Exam Auscultation: Breath sounds:-Normal. Adventitious sounds:- No Adventitious sounds.  Cardiovascular Auscultation:Rythm - Regular. Heart Sounds -Normal heart sounds.  Abdomen Inspection:-Inspection Normal.  Palpation/Perucssion: Palpation and Percussion of the abdomen reveal- Non Tender, No Rebound tenderness, No rigidity(Guarding) and No Palpable abdominal masses.  Liver:-Normal.  Spleen:- Normal.   Back- no cva tenderness.       Assessment & Plan:  You do have history of moderate to severe reflux type symptoms.  Worse over the past 2 weeks despite very strict diet and Nexium use.  All will prescribe famotidine add on medication.  If this does not improve her symptoms then start sucralfate as well.  Strict GERD/bland diet guidelines recommended.  Get scheduled for labs tomorrow to include CBC, CMP and amylase.  Also want you to go downstairs and talk with radiology department to get scheduled for abdomen ultrasound.  Went ahead and place referral to GI MD as he will probably need eventual EGD.  Also went ahead and made a referral to bariatric surgeon since he has history of gastric sleeve and have not seen surgeon for follow-up in 5 years.  Follow-up in 2 weeks or as needed.  Mackie Pai, PA-C

## 2020-06-24 ENCOUNTER — Other Ambulatory Visit: Payer: Self-pay

## 2020-06-24 ENCOUNTER — Other Ambulatory Visit (INDEPENDENT_AMBULATORY_CARE_PROVIDER_SITE_OTHER): Payer: BC Managed Care – PPO

## 2020-06-24 DIAGNOSIS — K219 Gastro-esophageal reflux disease without esophagitis: Secondary | ICD-10-CM

## 2020-06-24 LAB — COMPREHENSIVE METABOLIC PANEL
AG Ratio: 1.6 (calc) (ref 1.0–2.5)
ALT: 9 U/L (ref 6–29)
AST: 15 U/L (ref 10–35)
Albumin: 4 g/dL (ref 3.6–5.1)
Alkaline phosphatase (APISO): 72 U/L (ref 37–153)
BUN: 14 mg/dL (ref 7–25)
CO2: 29 mmol/L (ref 20–32)
Calcium: 9.4 mg/dL (ref 8.6–10.4)
Chloride: 104 mmol/L (ref 98–110)
Creat: 0.73 mg/dL (ref 0.50–1.05)
Globulin: 2.5 g/dL (calc) (ref 1.9–3.7)
Glucose, Bld: 78 mg/dL (ref 65–99)
Potassium: 4.9 mmol/L (ref 3.5–5.3)
Sodium: 140 mmol/L (ref 135–146)
Total Bilirubin: 0.5 mg/dL (ref 0.2–1.2)
Total Protein: 6.5 g/dL (ref 6.1–8.1)

## 2020-06-24 LAB — CBC WITH DIFFERENTIAL/PLATELET
Absolute Monocytes: 538 cells/uL (ref 200–950)
Basophils Absolute: 28 cells/uL (ref 0–200)
Basophils Relative: 0.6 %
Eosinophils Absolute: 110 cells/uL (ref 15–500)
Eosinophils Relative: 2.4 %
HCT: 39.2 % (ref 35.0–45.0)
Hemoglobin: 13 g/dL (ref 11.7–15.5)
Lymphs Abs: 1601 cells/uL (ref 850–3900)
MCH: 31.7 pg (ref 27.0–33.0)
MCHC: 33.2 g/dL (ref 32.0–36.0)
MCV: 95.6 fL (ref 80.0–100.0)
MPV: 9.8 fL (ref 7.5–12.5)
Monocytes Relative: 11.7 %
Neutro Abs: 2323 cells/uL (ref 1500–7800)
Neutrophils Relative %: 50.5 %
Platelets: 225 10*3/uL (ref 140–400)
RBC: 4.1 10*6/uL (ref 3.80–5.10)
RDW: 12.8 % (ref 11.0–15.0)
Total Lymphocyte: 34.8 %
WBC: 4.6 10*3/uL (ref 3.8–10.8)

## 2020-06-24 LAB — EXTRA SPECIMEN

## 2020-06-24 LAB — LIPASE: Lipase: 26 U/L (ref 7–60)

## 2020-06-26 ENCOUNTER — Other Ambulatory Visit: Payer: Self-pay

## 2020-06-26 ENCOUNTER — Ambulatory Visit (HOSPITAL_BASED_OUTPATIENT_CLINIC_OR_DEPARTMENT_OTHER)
Admission: RE | Admit: 2020-06-26 | Discharge: 2020-06-26 | Disposition: A | Discharge status: Home / Self Care | Payer: BC Managed Care – PPO | Source: Ambulatory Visit | Attending: Medical | Admitting: Medical

## 2020-06-26 DIAGNOSIS — K219 Gastro-esophageal reflux disease without esophagitis: Secondary | ICD-10-CM | POA: Insufficient documentation

## 2020-06-26 DIAGNOSIS — R1013 Epigastric pain: Secondary | ICD-10-CM

## 2020-06-26 DIAGNOSIS — K8020 Calculus of gallbladder without cholecystitis without obstruction: Secondary | ICD-10-CM | POA: Diagnosis not present

## 2020-06-26 LAB — HELICOBACTER PYLORI ABS-IGG+IGA, BLD
H. pylori, IgA Abs: <9 units (ref 0.0–8.9)
H. pylori, IgG AbS: 0.58 Index Value (ref 0.00–0.79)

## 2020-06-27 ENCOUNTER — Telehealth: Payer: Self-pay | Admitting: Medical

## 2020-06-27 DIAGNOSIS — K8020 Calculus of gallbladder without cholecystitis without obstruction: Secondary | ICD-10-CM

## 2020-06-27 NOTE — Telephone Encounter (Signed)
Refer to general surgeon placed for gall stones.

## 2020-06-30 ENCOUNTER — Encounter: Payer: Self-pay | Admitting: Gastroenterology

## 2020-07-01 DIAGNOSIS — Z903 Acquired absence of stomach [part of]: Secondary | ICD-10-CM | POA: Diagnosis not present

## 2020-07-01 DIAGNOSIS — K8020 Calculus of gallbladder without cholecystitis without obstruction: Secondary | ICD-10-CM | POA: Diagnosis not present

## 2020-07-06 ENCOUNTER — Other Ambulatory Visit: Payer: Self-pay | Admitting: Surgery

## 2020-07-06 DIAGNOSIS — Z903 Acquired absence of stomach [part of]: Secondary | ICD-10-CM

## 2020-07-09 ENCOUNTER — Other Ambulatory Visit: Payer: Self-pay | Admitting: Surgery

## 2020-07-09 ENCOUNTER — Ambulatory Visit
Admission: RE | Admit: 2020-07-09 | Discharge: 2020-07-09 | Disposition: A | Discharge status: Home / Self Care | Payer: BC Managed Care – PPO | Source: Ambulatory Visit | Attending: Surgery | Admitting: Surgery

## 2020-07-09 DIAGNOSIS — K224 Dyskinesia of esophagus: Secondary | ICD-10-CM | POA: Diagnosis not present

## 2020-07-09 DIAGNOSIS — Z903 Acquired absence of stomach [part of]: Secondary | ICD-10-CM

## 2020-07-09 DIAGNOSIS — K219 Gastro-esophageal reflux disease without esophagitis: Secondary | ICD-10-CM | POA: Diagnosis not present

## 2020-07-14 DIAGNOSIS — K8020 Calculus of gallbladder without cholecystitis without obstruction: Secondary | ICD-10-CM | POA: Diagnosis not present

## 2020-07-19 ENCOUNTER — Other Ambulatory Visit: Payer: Self-pay | Admitting: Medical

## 2020-07-19 MED FILL — FAMOTIDINE 20 MG TABS: 20 | 30 days supply | Qty: 60 | Fill #0

## 2020-07-19 MED FILL — SUCRALFATE 1 GM TABLET: 1 | 30 days supply | Qty: 90 | Fill #0

## 2020-07-30 ENCOUNTER — Encounter: Payer: Self-pay | Admitting: Gastroenterology

## 2020-07-30 ENCOUNTER — Other Ambulatory Visit: Payer: Self-pay

## 2020-07-30 ENCOUNTER — Ambulatory Visit (INDEPENDENT_AMBULATORY_CARE_PROVIDER_SITE_OTHER): Payer: BC Managed Care – PPO | Admitting: Gastroenterology

## 2020-07-30 VITALS — BP 116/80 | HR 60 | Ht 67.0 in | Wt 188.0 lb

## 2020-07-30 DIAGNOSIS — K8020 Calculus of gallbladder without cholecystitis without obstruction: Secondary | ICD-10-CM | POA: Diagnosis not present

## 2020-07-30 DIAGNOSIS — R933 Abnormal findings on diagnostic imaging of other parts of digestive tract: Secondary | ICD-10-CM

## 2020-07-30 DIAGNOSIS — K219 Gastro-esophageal reflux disease without esophagitis: Secondary | ICD-10-CM | POA: Diagnosis not present

## 2020-07-30 DIAGNOSIS — Z903 Acquired absence of stomach [part of]: Secondary | ICD-10-CM

## 2020-07-30 NOTE — Patient Instructions (Signed)
If you are age 52 or older, your body mass index should be between 23-30. Your Body mass index is 29.44 kg/m. If this is out of the aforementioned range listed, please consider follow up with your Primary Care Provider.  If you are age 77 or younger, your body mass index should be between 19-25. Your Body mass index is 29.44 kg/m. If this is out of the aformentioned range listed, please consider follow up with your Primary Care Provider.   Due to recent changes in healthcare laws, you may see the results of your imaging and laboratory studies on MyChart before your provider has had a chance to review them.  We understand that in some cases there may be results that are confusing or concerning to you. Not all laboratory results come back in the same time frame and the provider may be waiting for multiple results in order to interpret others.  Please give [place] 48 hours in order for your provider to thoroughly review all the results before contacting the office for clarification of your results.   It was a pleasure to see you today! Dr Bryan Lemma

## 2020-07-30 NOTE — Progress Notes (Signed)
Chief Complaint: GERD, abnormal upper GI series   Referring Provider:     Mackie Pai, PA-C   HPI:    Rita Miranda is a 54 y.o. female with a history of uterine CA, prior gastric sleeve referred to the [facility] for evaluation of reflux symptoms.  She has a longstanding history of reflux which predates her gastric sleeve approximately 6 years ago in [place] (? [facility] repair at that time as well).  Did have subsequent significant weight loss, but more recently has gained 20#.  Reflux started to recur a couple years after surgery, and restarted Nexium.  However, symptoms have continued to progress and now very bothersome.  Index symptoms of heartburn, regurgitation.  Regurgitation worse with forward flexion. No dysphagia.   Has trialed multiple medications to include Nexium, famotidine, and omeprazole. Was seen by her PCM for this issue in 05/2020. Currently prescribed Nexium 20 mg daily, Pepcid 20 mg twice daily, and Carafate 1 g 3 times daily with some improvement.   -RUQ [place] (06/26/2020): Contracted gallbladder with multiple GB stones measuring up to 4 mm, normal CBD, no cholecystitis  Was referred to Dr. Hassell Done at [place] [facility] for both evaluation of her history of gastric sleeve and cholelithiasis. -UGI series (07/09/2020): Postsurgical changes consistent with previous gastric sleeve, patulous GE junction with significant gastroesophageal reflux to the level of aortic arch. No evidence of stricture or HH. Mild esophageal dysmotility. No GOO -Per patient was offered CCY and RYGB by Dr. Hassell Done, but she declined bypass. Currently scheduled for laparoscopic cholecystectomy with IOC with Dr. Hassell Done on 10/04/2020.  Today, she states she would very much like to proceed with gastric bypass as a means to better control her reflux as her symptoms continue to progress.  Last colonoscopy approximately 5 years ago in [place].   Past Medical History:  Diagnosis Date  . Cancer ([place])     uterine  . Chest pain in adult 11/11/2018  . GERD (gastroesophageal reflux disease)   . Sinus bradycardia 03/17/2019     Past Surgical History:  Procedure Laterality Date  . ABDOMINAL HYSTERECTOMY    . LAPAROSCOPIC GASTRIC SLEEVE RESECTION WITH HIATAL HERNIA REPAIR     Family History  Problem Relation Age of Onset  . Heart disease Mother   . AAA (abdominal aortic aneurysm) Mother   . Coronary artery disease Brother   . Stroke Sister   . Colon cancer Neg Hx   . Stomach cancer Neg Hx   . Esophageal cancer Neg Hx   . Pancreatic cancer Neg Hx    Social History   Tobacco Use  . Smoking status: Never Smoker  . Smokeless tobacco: Never Used  Vaping Use  . Vaping Use: Never used  Substance Use Topics  . Alcohol use: No  . Drug use: No   Current Outpatient Medications  Medication Sig Dispense Refill  . esomeprazole (NEXIUM) 20 MG capsule Take 20 mg by mouth 2 (two) times daily before a meal.    . etodolac (LODINE) 300 MG capsule Take 1 capsule (300 mg total) by mouth 3 (three) times daily as needed. 21 capsule 0  . famotidine (PEPCID) 20 MG tablet TAKE 1 TABLET BY MOUTH TWICE DAILY 60 tablet 0  . sucralfate (CARAFATE) 1 g tablet TAKE 1 TABLET BY MOUTH THREE TIMES DAILY 90 tablet 0  . vitamin B-12 (CYANOCOBALAMIN) 100 MCG tablet Take 100 mcg by mouth daily.     No  current facility-administered medications for this visit.   Allergies  Allergen Reactions  . Other Hives    seafood     Review of Systems: All systems reviewed and negative except where noted in HPI.     Physical Exam:    Wt Readings from Last 3 Encounters:  07/30/20 188 lb (85.3 kg)  06/21/20 188 lb (85.3 kg)  12/19/19 180 lb (81.6 kg)    Ht 5\' 7"  (1.702 m)   Wt 188 lb (85.3 kg)   BMI 29.44 kg/m  Constitutional:  Pleasant, in no acute distress. Psychiatric: Normal mood and affect. Behavior is normal. EENT: Pupils normal.  Conjunctivae are normal. No scleral icterus. Neck supple. No cervical  LAD. Cardiovascular: Normal rate, regular rhythm. No edema Pulmonary/chest: Effort normal and breath sounds normal. No wheezing, rales or rhonchi. Abdominal: Soft, nondistended, nontender. Bowel sounds active throughout. There are no masses palpable. No hepatomegaly. Neurological: Alert and oriented to person place and time. Skin: Skin is warm and dry. No rashes noted.   ASSESSMENT AND PLAN;   1) GERD 2) History of gastric sleeve 3) Abnormal imaging study (reflux and LES laxity on UGI series) -History of gastric sleeve approximately 6 years ago and has had progressive reflux symptoms over the last 4 years or so.  Now on PPI, H2 RA and Carafate with some improvement, but still with regular breakthrough symptoms, particularly with regurgitation.  I had a long discussion with the patient and her spouse regarding the refluxigenic potential of gastric sleeve and agreed with previous advice re: gastric bypass conversion.  We discussed medications vs surgical options at length.  She would like me to reach out to Dr. Hassell Done to discuss.  I will coordinate care with him -Continue acid suppression therapy as currently prescribed -Continue antireflux lifestyle/dietary modifications -Surgical intervention per Dr. Hassell Done -I did discuss the possibility of performing preoperative EGD, but she is strongly opposed unless Dr. Hassell Done feels this is absolutely necessary  4) Cholelithiasis -Scheduled for ccy with IOC in January  5) Colon cancer screening -UTD.  Repeat 2026  RTC as needed  I spent 45 minutes of time, including in depth chart review, independent review of results as outlined above, communicating results with the patient directly, face-to-face time with the patient, coordinating care, ordering studies and medications as appropriate, and documentation.    Lavena Bullion, DO, FACG  07/30/2020, 9:35 AM   Saguier, Percell Miller, PA-C

## 2020-08-09 ENCOUNTER — Other Ambulatory Visit: Payer: Self-pay | Admitting: Medical

## 2020-09-06 ENCOUNTER — Other Ambulatory Visit: Payer: Self-pay | Admitting: Medical

## 2020-09-06 MED FILL — SUCRALFATE 1 GM TABLET: 1 | 30 days supply | Qty: 90 | Fill #0

## 2020-09-21 NOTE — Patient Instructions (Addendum)
DUE TO COVID-19 ONLY ONE VISITOR IS ALLOWED TO COME WITH YOU AND STAY IN THE WAITING ROOM ONLY DURING PRE OP AND PROCEDURE DAY OF SURGERY. THE 1 VISITOR  MAY VISIT WITH YOU AFTER SURGERY IN YOUR PRIVATE ROOM DURING VISITING HOURS ONLY!  YOU NEED TO HAVE A COVID 19 TEST ON: 09/28/20 @ 10:00 AM , THIS TEST MUST BE DONE BEFORE SURGERY,  COVID TESTING SITE [place] [id number], IT IS ON THE RIGHT GOING OUT [address] APPROXIMATELY  2 MINUTES PAST [employer] ON THE RIGHT. ONCE YOUR COVID TEST IS COMPLETED,  PLEASE BEGIN THE QUARANTINE INSTRUCTIONS AS OUTLINED IN YOUR HANDOUT.                Rita Miranda    Your procedure is scheduled on: 10/01/20   Report to [facility] Main  Entrance   Report to short stay at : 5:30 AM     Call this number if you have problems the morning of surgery [phone]    Remember: Do not eat food or drink liquids :After Midnight.   BRUSH YOUR TEETH MORNING OF SURGERY AND RINSE YOUR MOUTH OUT, NO CHEWING GUM CANDY OR MINTS.    Take these medicines the morning of surgery with A SIP OF WATER: Pepcid,omeprazole                               You may not have any metal on your body including hair pins and              piercings  Do not wear jewelry, make-up, lotions, powders or perfumes, deodorant             Do not wear nail polish on your fingernails.  Do not shave  48 hours prior to surgery.           Do not bring valuables to the hospital. [place].  Contacts, dentures or bridgework may not be worn into surgery.  Leave suitcase in the car. After surgery it may be brought to your room.     Patients discharged the day of surgery will not be allowed to drive home. IF YOU ARE HAVING SURGERY AND GOING HOME THE SAME DAY, YOU MUST HAVE AN ADULT TO DRIVE YOU HOME AND BE WITH YOU FOR 24 HOURS. YOU MAY GO HOME BY TAXI OR UBER OR ORTHERWISE, BUT AN ADULT MUST ACCOMPANY YOU HOME  AND STAY WITH YOU FOR 24 HOURS.  Name and phone number of your driver:  Special Instructions: N/A              Please read over the following fact sheets you were given: _____________________________________________________________________         [facility] - Preparing for Surgery Before surgery, you can play an important role.  Because skin is not sterile, your skin needs to be as free of germs as possible.  You can reduce the number of germs on your skin by washing with CHG (chlorahexidine gluconate) soap before surgery.  CHG is an antiseptic cleaner which kills germs and bonds with the skin to continue killing germs even after washing. Please DO NOT use if you have an allergy to CHG or antibacterial soaps.  If your skin becomes reddened/irritated stop using the CHG and inform your nurse  when you arrive at Short Stay. Do not shave (including legs and underarms) for at least 48 hours prior to the first CHG shower.  You may shave your face/neck. Please follow these instructions carefully:  1.  Shower with CHG Soap the night before surgery and the  morning of Surgery.  2.  If you choose to wash your hair, wash your hair first as usual with your  normal  shampoo.  3.  After you shampoo, rinse your hair and body thoroughly to remove the  shampoo.                           4.  Use CHG as you would any other liquid soap.  You can apply chg directly  to the skin and wash                       Gently with a scrungie or clean washcloth.  5.  Apply the CHG Soap to your body ONLY FROM THE NECK DOWN.   Do not use on face/ open                           Wound or open sores. Avoid contact with eyes, ears mouth and genitals (private parts).                       Wash face,  Genitals (private parts) with your normal soap.             6.  Wash thoroughly, paying special attention to the area where your surgery  will be performed.  7.  Thoroughly rinse your body with warm water from the neck down.  8.  DO NOT  shower/wash with your normal soap after using and rinsing off  the CHG Soap.                9.  Pat yourself dry with a clean towel.            10.  Wear clean pajamas.            11.  Place clean sheets on your bed the night of your first shower and do not  sleep with pets. Day of Surgery : Do not apply any lotions/deodorants the morning of surgery.  Please wear clean clothes to the hospital/surgery center.  FAILURE TO FOLLOW THESE INSTRUCTIONS MAY RESULT IN THE CANCELLATION OF YOUR SURGERY PATIENT SIGNATURE_________________________________  NURSE SIGNATURE__________________________________  ________________________________________________________________________

## 2020-09-22 ENCOUNTER — Encounter (HOSPITAL_COMMUNITY)
Admission: RE | Admit: 2020-09-22 | Discharge: 2020-09-22 | Disposition: A | Discharge status: Home / Self Care | Payer: BC Managed Care – PPO | Source: Ambulatory Visit | Attending: Surgery | Admitting: Surgery

## 2020-09-22 ENCOUNTER — Encounter (INDEPENDENT_AMBULATORY_CARE_PROVIDER_SITE_OTHER): Payer: Self-pay

## 2020-09-22 ENCOUNTER — Encounter (HOSPITAL_COMMUNITY): Payer: Self-pay

## 2020-09-22 ENCOUNTER — Other Ambulatory Visit: Payer: Self-pay

## 2020-09-22 DIAGNOSIS — Z01818 Encounter for other preprocedural examination: Secondary | ICD-10-CM | POA: Diagnosis not present

## 2020-09-22 HISTORY — DX: Other complications of anesthesia, initial encounter: T88.59XA

## 2020-09-22 HISTORY — DX: Anemia, unspecified: D64.9

## 2020-09-22 HISTORY — DX: Nausea with vomiting, unspecified: R11.2

## 2020-09-22 HISTORY — DX: Other specified postprocedural states: Z98.890

## 2020-09-22 HISTORY — DX: Bradycardia, unspecified: R00.1

## 2020-09-22 LAB — CBC
HCT: 39.6 % (ref 36.0–46.0)
Hemoglobin: 12.7 g/dL (ref 12.0–15.0)
MCH: 31.8 pg (ref 26.0–34.0)
MCHC: 32.1 g/dL (ref 30.0–36.0)
MCV: 99 fL (ref 80.0–100.0)
Platelets: 260 10*3/uL (ref 150–400)
RBC: 4 MIL/uL (ref 3.87–5.11)
RDW: 13.2 % (ref 11.5–15.5)
WBC: 5.9 10*3/uL (ref 4.0–10.5)
nRBC: 0 % (ref 0.0–0.2)

## 2020-09-22 NOTE — Progress Notes (Signed)
COVID Vaccine Completed: Yes Date COVID Vaccine completed: 05/2020. [place] COVID vaccine manufacturer: Pfizer      PCP - Ramon Dredge Saguier: PA-C Cardiologist - NO  Chest x-ray -  EKG -  Stress Test -  ECHO - 11/13/18 Cardiac Cath -  Pacemaker/ICD device last checked:  Sleep Study -  CPAP -   Fasting Blood Sugar -  Checks Blood Sugar _____ times a day  Blood Thinner Instructions: Aspirin Instructions: Last Dose:  Anesthesia review: Hx: Bradycardia.  Patient denies shortness of breath, fever, cough and chest pain at PAT appointment   Patient verbalized understanding of instructions that were given to them at the PAT appointment. Patient was also instructed that they will need to review over the PAT instructions again at home before surgery.

## 2020-09-22 NOTE — H&P (Signed)
Rita Miranda Appointment: 07/14/2020 3:30 PM Location: [place] Office Patient #: [id number] DOB: 09-06-1966 Married / Language: English / Race: White Female   History of Present Illness Molli Hazard B. Daphine Deutscher MD; 07/14/2020 3:45 PM) The patient is a 54 year old female who presents for evaluation of gall stones. follow-up exam and discussion after recent upper GI series. She has had back pain and some upper abdominal pain and also significant new gastroesophageal reflux. Ultrasound done on October 2 showed multiple gallstones up to 4 mm. She was seen in the office by me and I got an upper GI series which shows by my exam and review free flow into the stomach O with some reflux but no obvious hiatal hernia. I discussed the findings with her and suggested that her symptoms may be all related to chronic cholecystitis and gallstones. We discussed laparoscopic cholecystectomy with possible intraoperative cholangiogram and at the same time perhaps we could look beneath her liver and look at her esophagogastric junction and see if there is a recurrent hiatal hernia which we might could repair.  I gave her a gallbladder booklet and discussed the risks and complications not limited to common bile duct injury or bowel leaks or bowel injury or bleeding. She wants to move forward with this so we will schedule this at a [facility] for laparoscopic cholecystectomy with intraoperative cholangiogram and will plan to look at her esophageal hiatus at the same time.   Allergies Doristine Devoid, CMA; 07/14/2020 3:24 PM) No Known Drug Allergies  [07/01/2020]:  Medication History Doristine Devoid, CMA; 07/14/2020 3:24 PM) Carafate (1GM Tablet, Oral) Active. NexIUM (20MG  Capsule DR, Oral) Active. Famotidine (20MG  Tablet, Oral) Active. Medications Reconciled  Vitals (Chemira Jones CMA; 07/14/2020 3:24 PM) 07/14/2020 3:24 PM Weight: 186 lb Height: 68in Body Surface Area: 1.98 m Body Mass Index: 28.28  kg/m  Pulse: 65 (Regular)  BP: 110/70(Sitting, Left Arm, Standard)       Physical Exam (Rayven Hendrickson B. 07/16/2020 MD; 07/14/2020 3:47 PM) General Note: well-developed well-nourished white female no acute distress  HEENT exam unremarkable Neck supple without adenopathy Chest is clear to auscultation Heart sinus rhythm without murmurs or gallops Abdomen with weight loss and no abdominal masses. Prior laparoscopy Extremity exam for range of motion cyanosis edema or clubbing. Neuro alert and oriented 3. Motor and sensory function grossly intact. She has a good fund of knowledge     Assessment & Plan Daphine Deutscher B. 07/16/2020 MD; 07/14/2020 3:48 PM) GALLSTONES (K80.20) Impression: pain episodes suggestive of cholecystitis no obvious evidence of acute cholecystitis on a recent ultrasound at [facility] [place]. Will move forward with lap chole and assess GE junction at the same time.  Signed by Daphine Deutscher, MD (07/14/2020 3:48 PM)

## 2020-09-28 ENCOUNTER — Other Ambulatory Visit (HOSPITAL_COMMUNITY)
Admission: RE | Admit: 2020-09-28 | Discharge: 2020-09-28 | Disposition: A | Discharge status: Home / Self Care | Payer: BC Managed Care – PPO | Source: Ambulatory Visit | Attending: Surgery | Admitting: Surgery

## 2020-09-28 DIAGNOSIS — Z01812 Encounter for preprocedural laboratory examination: Secondary | ICD-10-CM | POA: Insufficient documentation

## 2020-09-28 DIAGNOSIS — K8020 Calculus of gallbladder without cholecystitis without obstruction: Secondary | ICD-10-CM | POA: Diagnosis not present

## 2020-09-28 DIAGNOSIS — Z20822 Contact with and (suspected) exposure to covid-19: Secondary | ICD-10-CM | POA: Insufficient documentation

## 2020-09-28 DIAGNOSIS — K8010 Calculus of gallbladder with chronic cholecystitis without obstruction: Secondary | ICD-10-CM | POA: Diagnosis not present

## 2020-09-28 DIAGNOSIS — K219 Gastro-esophageal reflux disease without esophagitis: Secondary | ICD-10-CM | POA: Diagnosis not present

## 2020-09-28 LAB — SARS CORONAVIRUS 2 (TAT 6-24 HRS): SARS Coronavirus 2: NEGATIVE

## 2020-09-30 NOTE — Anesthesia Preprocedure Evaluation (Addendum)
Anesthesia Evaluation  Patient identified by MRN, date of birth, ID band Patient awake    Reviewed: Allergy & Precautions, NPO status , Patient's Chart, lab work & pertinent test results  History of Anesthesia Complications (+) PONV and history of anesthetic complications  Airway Mallampati: II  TM Distance: >3 FB Neck ROM: Full    Dental no notable dental hx.    Pulmonary neg pulmonary ROS,    Pulmonary exam normal breath sounds clear to auscultation       Cardiovascular Exercise Tolerance: Good negative cardio ROS Normal cardiovascular exam Rhythm:Regular Rate:Normal     Neuro/Psych negative neurological ROS  negative psych ROS   GI/Hepatic Neg liver ROS, GERD  ,Chronic cholecystitis   Endo/Other  negative endocrine ROS  Renal/GU negative Renal ROS  negative genitourinary   Musculoskeletal negative musculoskeletal ROS (+)   Abdominal Normal abdominal exam  (+)   Peds  Hematology  (+) anemia ,   Anesthesia Other Findings   Reproductive/Obstetrics negative OB ROS                            Anesthesia Physical Anesthesia Plan  ASA: II  Anesthesia Plan: General   Post-op Pain Management:    Induction: Intravenous  PONV Risk Score and Plan: Midazolam, Scopolamine patch - Pre-op, Dexamethasone and Ondansetron  Airway Management Planned: Oral ETT  Additional Equipment: None  Intra-op Plan:   Post-operative Plan: Extubation in OR  Informed Consent: I have reviewed the patients History and Physical, chart, labs and discussed the procedure including the risks, benefits and alternatives for the proposed anesthesia with the patient or authorized representative who has indicated his/her understanding and acceptance.       Plan Discussed with: CRNA, Anesthesiologist and Surgeon  Anesthesia Plan Comments:        Anesthesia Quick Evaluation

## 2020-10-01 ENCOUNTER — Ambulatory Visit (HOSPITAL_COMMUNITY): Payer: BC Managed Care – PPO

## 2020-10-01 ENCOUNTER — Encounter (HOSPITAL_COMMUNITY): Payer: Self-pay | Admitting: Surgery

## 2020-10-01 ENCOUNTER — Ambulatory Visit (HOSPITAL_COMMUNITY): Payer: BC Managed Care – PPO | Admitting: Certified Registered Nurse Anesthetist

## 2020-10-01 ENCOUNTER — Other Ambulatory Visit (HOSPITAL_COMMUNITY): Payer: Self-pay | Admitting: Surgery

## 2020-10-01 ENCOUNTER — Encounter (HOSPITAL_COMMUNITY)
Admission: RE | Disposition: A | Discharge status: Home / Self Care | Payer: Self-pay | Source: Home / Self Care | Attending: Surgery

## 2020-10-01 ENCOUNTER — Ambulatory Visit (HOSPITAL_COMMUNITY)
Admission: RE | Admit: 2020-10-01 | Discharge: 2020-10-01 | Disposition: A | Discharge status: Home / Self Care | Payer: BC Managed Care – PPO | Attending: Surgery | Admitting: Surgery

## 2020-10-01 DIAGNOSIS — K219 Gastro-esophageal reflux disease without esophagitis: Secondary | ICD-10-CM | POA: Diagnosis not present

## 2020-10-01 DIAGNOSIS — K8020 Calculus of gallbladder without cholecystitis without obstruction: Secondary | ICD-10-CM | POA: Diagnosis not present

## 2020-10-01 DIAGNOSIS — Z9049 Acquired absence of other specified parts of digestive tract: Secondary | ICD-10-CM

## 2020-10-01 DIAGNOSIS — Z419 Encounter for procedure for purposes other than remedying health state, unspecified: Secondary | ICD-10-CM

## 2020-10-01 DIAGNOSIS — K8010 Calculus of gallbladder with chronic cholecystitis without obstruction: Secondary | ICD-10-CM | POA: Diagnosis not present

## 2020-10-01 DIAGNOSIS — D649 Anemia, unspecified: Secondary | ICD-10-CM | POA: Diagnosis not present

## 2020-10-01 DIAGNOSIS — Z20822 Contact with and (suspected) exposure to covid-19: Secondary | ICD-10-CM | POA: Diagnosis not present

## 2020-10-01 DIAGNOSIS — R001 Bradycardia, unspecified: Secondary | ICD-10-CM | POA: Diagnosis not present

## 2020-10-01 HISTORY — PX: CHOLECYSTECTOMY: SHX55

## 2020-10-01 SURGERY — LAPAROSCOPIC CHOLECYSTECTOMY WITH INTRAOPERATIVE CHOLANGIOGRAM
Anesthesia: General | Site: Abdomen

## 2020-10-01 MED ORDER — CEFAZOLIN SODIUM-DEXTROSE 2-4 GM/100ML-% IV SOLN
2.0000 g | INTRAVENOUS | Status: AC
  Administered 2020-10-01: 2 g via INTRAVENOUS
  Filled 2020-10-01: qty 100

## 2020-10-01 MED ORDER — CHLORHEXIDINE GLUCONATE CLOTH 2 % EX PADS: 6.0000 | MEDICATED_PAD | Freq: Once | CUTANEOUS | Status: DC

## 2020-10-01 MED ORDER — PROMETHAZINE HCL 25 MG/ML IJ SOLN: 6.2500 mg | INTRAMUSCULAR | Status: DC | PRN

## 2020-10-01 MED ORDER — ACETAMINOPHEN 500 MG PO TABS
1000.0000 mg | ORAL_TABLET | ORAL | Status: AC
  Administered 2020-10-01: 1000 mg via ORAL
  Filled 2020-10-01: qty 2

## 2020-10-01 MED ORDER — EPHEDRINE 5 MG/ML INJ
INTRAVENOUS | Status: AC
  Filled 2020-10-01: qty 10

## 2020-10-01 MED ORDER — LIDOCAINE 2% (20 MG/ML) 5 ML SYRINGE
INTRAMUSCULAR | Status: DC | PRN
  Administered 2020-10-01: 1.5 mg/kg/h via INTRAVENOUS

## 2020-10-01 MED ORDER — SUGAMMADEX SODIUM 200 MG/2ML IV SOLN
INTRAVENOUS | Status: DC | PRN
  Administered 2020-10-01: 200 mg via INTRAVENOUS

## 2020-10-01 MED ORDER — ESMOLOL HCL 100 MG/10ML IV SOLN
INTRAVENOUS | Status: DC | PRN
  Administered 2020-10-01: 20 mg via INTRAVENOUS

## 2020-10-01 MED ORDER — ESMOLOL HCL 100 MG/10ML IV SOLN
INTRAVENOUS | Status: AC
  Filled 2020-10-01: qty 10

## 2020-10-01 MED ORDER — BUPIVACAINE LIPOSOME 1.3 % IJ SUSP
INTRAMUSCULAR | Status: DC | PRN
  Administered 2020-10-01: 20 mL

## 2020-10-01 MED ORDER — FENTANYL CITRATE (PF) 100 MCG/2ML IJ SOLN
INTRAMUSCULAR | Status: AC
  Administered 2020-10-01: 50 ug via INTRAVENOUS
  Filled 2020-10-01: qty 2

## 2020-10-01 MED ORDER — CHLORHEXIDINE GLUCONATE 0.12 % MT SOLN
15.0000 mL | Freq: Once | OROMUCOSAL | Status: AC
  Administered 2020-10-01: 15 mL via OROMUCOSAL

## 2020-10-01 MED ORDER — PHENYLEPHRINE HCL-NACL 10-0.9 MG/250ML-% IV SOLN
INTRAVENOUS | Status: DC | PRN
  Administered 2020-10-01: 35 ug/min via INTRAVENOUS

## 2020-10-01 MED ORDER — FENTANYL CITRATE (PF) 100 MCG/2ML IJ SOLN
INTRAMUSCULAR | Status: DC | PRN
  Administered 2020-10-01 (×4): 50 ug via INTRAVENOUS

## 2020-10-01 MED ORDER — ROCURONIUM BROMIDE 10 MG/ML (PF) SYRINGE
PREFILLED_SYRINGE | INTRAVENOUS | Status: DC | PRN
  Administered 2020-10-01: 70 mg via INTRAVENOUS
  Administered 2020-10-01: 10 mg via INTRAVENOUS

## 2020-10-01 MED ORDER — ORAL CARE MOUTH RINSE: 15.0000 mL | Freq: Once | OROMUCOSAL | Status: AC

## 2020-10-01 MED ORDER — FENTANYL CITRATE (PF) 250 MCG/5ML IJ SOLN
INTRAMUSCULAR | Status: AC
  Filled 2020-10-01: qty 5

## 2020-10-01 MED ORDER — PHENYLEPHRINE HCL (PRESSORS) 10 MG/ML IV SOLN
INTRAVENOUS | Status: AC
  Filled 2020-10-01: qty 1

## 2020-10-01 MED ORDER — FENTANYL CITRATE (PF) 100 MCG/2ML IJ SOLN
25.0000 ug | INTRAMUSCULAR | Status: DC | PRN
  Administered 2020-10-01: 25 ug via INTRAVENOUS

## 2020-10-01 MED ORDER — LIDOCAINE HCL (PF) 2 % IJ SOLN
INTRAMUSCULAR | Status: AC
  Filled 2020-10-01: qty 10

## 2020-10-01 MED ORDER — LACTATED RINGERS IR SOLN
Status: DC | PRN
  Administered 2020-10-01: 1000 mL

## 2020-10-01 MED ORDER — BUPIVACAINE LIPOSOME 1.3 % IJ SUSP
20.0000 mL | Freq: Once | INTRAMUSCULAR | Status: DC
  Filled 2020-10-01: qty 20

## 2020-10-01 MED ORDER — AMISULPRIDE (ANTIEMETIC) 5 MG/2ML IV SOLN: 10.0000 mg | Freq: Once | INTRAVENOUS | Status: DC | PRN

## 2020-10-01 MED ORDER — PROPOFOL 10 MG/ML IV BOLUS
INTRAVENOUS | Status: DC | PRN
  Administered 2020-10-01: 150 mg via INTRAVENOUS

## 2020-10-01 MED ORDER — LIDOCAINE 2% (20 MG/ML) 5 ML SYRINGE
INTRAMUSCULAR | Status: DC | PRN
  Administered 2020-10-01: 80 mg via INTRAVENOUS

## 2020-10-01 MED ORDER — LACTATED RINGERS IV SOLN: INTRAVENOUS | Status: DC

## 2020-10-01 MED ORDER — ROCURONIUM BROMIDE 10 MG/ML (PF) SYRINGE
PREFILLED_SYRINGE | INTRAVENOUS | Status: AC
  Filled 2020-10-01: qty 10

## 2020-10-01 MED ORDER — HYDROCODONE-ACETAMINOPHEN 5-325 MG PO TABS: 1.0000 | ORAL_TABLET | Freq: Four times a day (QID) | ORAL | 0 refills | Status: DC | PRN

## 2020-10-01 MED ORDER — MIDAZOLAM HCL 2 MG/2ML IJ SOLN
INTRAMUSCULAR | Status: AC
  Filled 2020-10-01: qty 2

## 2020-10-01 MED ORDER — ONDANSETRON HCL 4 MG/2ML IJ SOLN
INTRAMUSCULAR | Status: AC
  Filled 2020-10-01: qty 2

## 2020-10-01 MED ORDER — OXYCODONE HCL 5 MG/5ML PO SOLN: 5.0000 mg | Freq: Once | ORAL | Status: DC | PRN

## 2020-10-01 MED ORDER — SCOPOLAMINE 1 MG/3DAYS TD PT72
1.0000 | MEDICATED_PATCH | TRANSDERMAL | Status: DC
  Administered 2020-10-01: 1.5 mg via TRANSDERMAL
  Filled 2020-10-01: qty 1

## 2020-10-01 MED ORDER — DEXAMETHASONE SODIUM PHOSPHATE 10 MG/ML IJ SOLN
INTRAMUSCULAR | Status: DC | PRN
  Administered 2020-10-01: 10 mg via INTRAVENOUS

## 2020-10-01 MED ORDER — OXYCODONE HCL 5 MG PO TABS: 5.0000 mg | ORAL_TABLET | Freq: Once | ORAL | Status: DC | PRN

## 2020-10-01 MED ORDER — 0.9 % SODIUM CHLORIDE (POUR BTL) OPTIME
TOPICAL | Status: DC | PRN
  Administered 2020-10-01: 1000 mL

## 2020-10-01 MED ORDER — DEXAMETHASONE SODIUM PHOSPHATE 10 MG/ML IJ SOLN
INTRAMUSCULAR | Status: AC
  Filled 2020-10-01: qty 1

## 2020-10-01 MED ORDER — PROPOFOL 10 MG/ML IV BOLUS
INTRAVENOUS | Status: AC
  Filled 2020-10-01: qty 20

## 2020-10-01 MED ORDER — PHENYLEPHRINE 40 MCG/ML (10ML) SYRINGE FOR IV PUSH (FOR BLOOD PRESSURE SUPPORT)
PREFILLED_SYRINGE | INTRAVENOUS | Status: DC | PRN
  Administered 2020-10-01 (×2): 120 ug via INTRAVENOUS

## 2020-10-01 MED ORDER — ONDANSETRON HCL 4 MG/2ML IJ SOLN
INTRAMUSCULAR | Status: DC | PRN
  Administered 2020-10-01: 4 mg via INTRAVENOUS

## 2020-10-01 MED ORDER — MIDAZOLAM HCL 5 MG/5ML IJ SOLN
INTRAMUSCULAR | Status: DC | PRN
  Administered 2020-10-01: 2 mg via INTRAVENOUS

## 2020-10-01 MED FILL — HYDROCODON-APAP 5-325: 5-325 | 3 days supply | Qty: 15 | Fill #0

## 2020-10-01 SURGICAL SUPPLY — 41 items
APPLICATOR COTTON TIP 6 STRL (MISCELLANEOUS) ×2 IMPLANT
APPLICATOR COTTON TIP 6IN STRL (MISCELLANEOUS) ×4
APPLIER CLIP 5 13 M/L LIGAMAX5 (MISCELLANEOUS)
APPLIER CLIP ROT 10 11.4 M/L (STAPLE)
BENZOIN TINCTURE PRP APPL 2/3 (GAUZE/BANDAGES/DRESSINGS) IMPLANT
CABLE HIGH FREQUENCY MONO STRZ (ELECTRODE) IMPLANT
CATH REDDICK CHOLANGI 4FR 50CM (CATHETERS) ×2 IMPLANT
CLIP APPLIE 5 13 M/L LIGAMAX5 (MISCELLANEOUS) IMPLANT
CLIP APPLIE ROT 10 11.4 M/L (STAPLE) IMPLANT
COVER MAYO STAND STRL (DRAPES) ×2 IMPLANT
COVER SURGICAL LIGHT HANDLE (MISCELLANEOUS) ×2 IMPLANT
COVER WAND RF STERILE (DRAPES) IMPLANT
DECANTER SPIKE VIAL GLASS SM (MISCELLANEOUS) ×2 IMPLANT
DERMABOND ADVANCED (GAUZE/BANDAGES/DRESSINGS) ×1
DERMABOND ADVANCED .7 DNX12 (GAUZE/BANDAGES/DRESSINGS) ×1 IMPLANT
DEVICE TROCAR PUNCTURE CLOSURE (ENDOMECHANICALS) ×2 IMPLANT
DRAPE C-ARM 42X120 X-RAY (DRAPES) ×2 IMPLANT
ELECT L-HOOK LAP 45CM DISP (ELECTROSURGICAL) ×2
ELECT PENCIL ROCKER SW 15FT (MISCELLANEOUS) IMPLANT
ELECT REM PT RETURN 15FT ADLT (MISCELLANEOUS) ×2 IMPLANT
ELECTRODE L-HOOK LAP 45CM DISP (ELECTROSURGICAL) ×1 IMPLANT
GLOVE BIOGEL M 8.0 STRL (GLOVE) ×2 IMPLANT
GOWN STRL REUS W/TWL XL LVL3 (GOWN DISPOSABLE) ×2 IMPLANT
HEMOSTAT SURGICEL 4X8 (HEMOSTASIS) IMPLANT
IV CATH 14GX2 1/4 (CATHETERS) ×2 IMPLANT
KIT BASIN OR (CUSTOM PROCEDURE TRAY) ×2 IMPLANT
KIT TURNOVER KIT A (KITS) IMPLANT
POUCH RETRIEVAL ECOSAC 10 (ENDOMECHANICALS) ×1 IMPLANT
POUCH RETRIEVAL ECOSAC 10MM (ENDOMECHANICALS) ×1
SCISSORS LAP 5X45 EPIX DISP (ENDOMECHANICALS) ×2 IMPLANT
SET IRRIG TUBING LAPAROSCOPIC (IRRIGATION / IRRIGATOR) ×2 IMPLANT
SET TUBE SMOKE EVAC HIGH FLOW (TUBING) ×2 IMPLANT
SLEEVE XCEL OPT CAN 5 100 (ENDOMECHANICALS) ×2 IMPLANT
STRIP CLOSURE SKIN 1/2X4 (GAUZE/BANDAGES/DRESSINGS) IMPLANT
SUT MNCRL AB 4-0 PS2 18 (SUTURE) ×4 IMPLANT
SYR 20ML LL LF (SYRINGE) ×2 IMPLANT
TOWEL OR 17X26 10 PK STRL BLUE (TOWEL DISPOSABLE) ×2 IMPLANT
TRAY LAPAROSCOPIC (CUSTOM PROCEDURE TRAY) ×2 IMPLANT
TROCAR BLADELESS OPT 5 100 (ENDOMECHANICALS) ×2 IMPLANT
TROCAR XCEL BLUNT TIP 100MML (ENDOMECHANICALS) IMPLANT
TROCAR XCEL NON-BLD 11X100MML (ENDOMECHANICALS) IMPLANT

## 2020-10-01 NOTE — Anesthesia Procedure Notes (Signed)
Procedure Name: Intubation Date/Time: 10/01/2020 7:32 AM Performed by: Maxwell Caul, CRNA Pre-anesthesia Checklist: Patient identified, Emergency Drugs available, Suction available and Patient being monitored Patient Re-evaluated:Patient Re-evaluated prior to induction Oxygen Delivery Method: Circle system utilized Preoxygenation: Pre-oxygenation with 100% oxygen Induction Type: IV induction Ventilation: Mask ventilation without difficulty Laryngoscope Size: Mac and 4 Grade View: Grade I Tube type: Oral Tube size: 7.5 mm Number of attempts: 1 Airway Equipment and Method: Stylet Placement Confirmation: ETT inserted through vocal cords under direct vision,  positive ETCO2 and breath sounds checked- equal and bilateral Secured at: 21 cm Tube secured with: Tape Dental Injury: Teeth and Oropharynx as per pre-operative assessment

## 2020-10-01 NOTE — Transfer of Care (Signed)
Immediate Anesthesia Transfer of Care Note  Patient: Rita Miranda  Procedure(s) Performed: LAPAROSCOPIC CHOLECYSTECTOMY WITH ATTEMPTED INTRAOPERATIVE CHOLANGIOGRAM AND EXAMINE HIATUS FOR HIATAL HERNIA (N/A Abdomen)  Patient Location: PACU  Anesthesia Type:General  Level of Consciousness: awake, alert  and oriented  Airway & Oxygen Therapy: Patient Spontanous Breathing and Patient connected to face mask oxygen  Post-op Assessment: Report given to RN and Post -op Vital signs reviewed and stable  Post vital signs: Reviewed and stable  Last Vitals:  Vitals Value Taken Time  BP 108/73 10/01/20 0851  Temp    Pulse 89 10/01/20 0853  Resp 17 10/01/20 0853  SpO2 100 % 10/01/20 0853  Vitals shown include unvalidated device data.  Last Pain:  Vitals:   10/01/20 0635  TempSrc:   PainSc: 8       Patients Stated Pain Goal: 7 (20/25/42 7062)  Complications: No complications documented.

## 2020-10-01 NOTE — Anesthesia Postprocedure Evaluation (Signed)
Anesthesia Post Note  Patient: Rita Miranda  Procedure(s) Performed: LAPAROSCOPIC CHOLECYSTECTOMY WITH ATTEMPTED INTRAOPERATIVE CHOLANGIOGRAM AND EXAMINE HIATUS FOR HIATAL HERNIA (N/A Abdomen)     Patient location during evaluation: PACU Anesthesia Type: General Level of consciousness: awake Pain management: pain level controlled Vital Signs Assessment: post-procedure vital signs reviewed and stable Respiratory status: spontaneous breathing and respiratory function stable Cardiovascular status: stable Postop Assessment: no apparent nausea or vomiting Anesthetic complications: no   No complications documented.  Last Vitals:  Vitals:   10/01/20 0915 10/01/20 0930  BP: 110/77 99/81  Pulse: 72 85  Resp: 11 15  Temp:    SpO2: 99% 100%                  Merlinda Frederick

## 2020-10-01 NOTE — Discharge Instructions (Signed)
Laparoscopic Cholecystectomy °Laparoscopic cholecystectomy is surgery to remove the gallbladder. The gallbladder is a pear-shaped organ that lies beneath the liver on the right side of the body. The gallbladder stores bile, which is a fluid that helps the body to digest fats. Cholecystectomy is often done for inflammation of the gallbladder (cholecystitis). This condition is usually caused by a buildup of gallstones (cholelithiasis) in the gallbladder. Gallstones can block the flow of bile, which can result in inflammation and pain. In severe cases, emergency surgery may be required. °This procedure is done though small incisions in your abdomen (laparoscopic surgery). A thin scope with a camera (laparoscope) is inserted through one incision. Thin surgical instruments are inserted through the other incisions. In some cases, a laparoscopic procedure may be turned into a type of surgery that is done through a larger incision (open surgery). °Tell a health care provider about: °· Any allergies you have. °· All medicines you are taking, including vitamins, herbs, eye drops, creams, and over-the-counter medicines. °· Any problems you or family members have had with anesthetic medicines. °· Any blood disorders you have. °· Any surgeries you have had. °· Any medical conditions you have. °· Whether you are pregnant or may be pregnant. °What are the risks? °Generally, this is a safe procedure. However, problems may occur, including: °· Infection. °· Bleeding. °· Allergic reactions to medicines. °· Damage to other structures or organs. °· A stone remaining in the common bile duct. The common bile duct carries bile from the gallbladder into the small intestine. °· A bile leak from the cyst duct that is clipped when your gallbladder is removed. °What happens before the procedure? °Staying hydrated °Follow instructions from your health care provider about hydration, which may include: °· Up to 2 hours before the procedure - you  may continue to drink clear liquids, such as water, clear fruit juice, black coffee, and plain tea. °Eating and drinking restrictions °Follow instructions from your health care provider about eating and drinking, which may include: °· 8 hours before the procedure - stop eating heavy meals or foods such as meat, fried foods, or fatty foods. °· 6 hours before the procedure - stop eating light meals or foods, such as toast or cereal. °· 6 hours before the procedure - stop drinking milk or drinks that contain milk. °· 2 hours before the procedure - stop drinking clear liquids. °Medicines °· Ask your health care provider about: °? Changing or stopping your regular medicines. This is especially important if you are taking diabetes medicines or blood thinners. °? Taking medicines such as aspirin and ibuprofen. These medicines can thin your blood. Do not take these medicines before your procedure if your health care provider instructs you not to. °· You may be given antibiotic medicine to help prevent infection. °General instructions °· Let your health care provider know if you develop a cold or an infection before surgery. °· Plan to have someone take you home from the hospital or clinic. °· Ask your health care provider how your surgical site will be marked or identified. °What happens during the procedure? ° °· To reduce your risk of infection: °? Your health care team will wash or sanitize their hands. °? Your skin will be washed with soap. °? Hair may be removed from the surgical area. °· An IV tube may be inserted into one of your veins. °· You will be given one or more of the following: °? A medicine to help you relax (sedative). °? A   medicine to make you fall asleep (general anesthetic).  A breathing tube will be placed in your mouth.  Your surgeon will make several small cuts (incisions) in your abdomen.  The laparoscope will be inserted through one of the small incisions. The camera on the laparoscope will  send images to a TV screen (monitor) in the operating room. This lets your surgeon see inside your abdomen.  Air-like gas will be pumped into your abdomen. This will expand your abdomen to give the surgeon more room to perform the surgery.  Other tools that are needed for the procedure will be inserted through the other incisions. The gallbladder will be removed through one of the incisions.  Your common bile duct may be examined. If stones are found in the common bile duct, they may be removed.  After your gallbladder has been removed, the incisions will be closed with stitches (sutures), staples, or skin glue.  Your incisions may be covered with a bandage (dressing). The procedure may vary among health care providers and hospitals. What happens after the procedure?  Your blood pressure, heart rate, breathing rate, and blood oxygen level will be monitored until the medicines you were given have worn off.  You will be given medicines as needed to control your pain.  Do not drive for 24 hours if you were given a sedative. This information is not intended to replace advice given to you by your health care provider. Make sure you discuss any questions you have with your health care provider. Document Revised: 08/24/2017 Document Reviewed: 02/28/2016 Elsevier Patient Education  2020 [employer]  

## 2020-10-01 NOTE — Op Note (Signed)
Rita Miranda  Primary Care Physician:  Elise Benne    10/01/2020  8:46 AM  Procedure: Laparoscopic Cholecystectomy with attempted  intraoperative cholangiogram-small cystic duct unable to pass  Surgeon: Catalina Antigua B. Hassell Done, MD, FACS Asst:  Louanna Raw, MD  Anes:  General  Drains:  None  Findings: Chronic cholecystitis with a gallbladder packed with stones  Description of Procedure: The patient was taken to OR 4 and given general anesthesia.  The patient was prepped with chlorohexidine prep and draped sterilely. A time out was performed including identifying the patient and discussing their procedure.  Access to the abdomen was achieved with a 5 mm Optiview through the right upper quadrant.  Port placement included three 5 mm trocars and one 12 mm trocar.    The gallbladder was visualized and appeared gray and thickened with numerous stones within.   The fundus of the gallbaldder was grasped and the gallbladder was elevated. Traction on the infundibulum allowed for successful demonstration of the critical view. Inflammatory changes were chronic.  The cystic duct was identified and clipped up on the gallbladder and an incision was made in the cystic duct and no stones or sludge was in the cystic duct.  A valve and small duct precluded passage of the cholangiogram.      The cystic duct was then triple clipped and divided, the cystic artery was double clipped and divided and then the gallbladder was removed from the gallbladder bed. Removal of the gallbladder from the gallbladder bed was performed without entering the gallbladder and there was no spillage of stones.  The gallbladder was then placed in a bag and brought out through one of the trocar sites. The gallbladder bed was inspected and no bleeding or bile leaks were seen.   Laparoscopic visualization was used when closing the fascial defect of the upper trocar which was done anteriorly with a 0 vicryl.   Incisions were injected  with Exparel and closed with 4-0 Monocryl and Dermabond on the skin.  Sponge and needle count were correct.    The patient was taken to the recovery room in satisfactory condition.

## 2020-10-01 NOTE — Interval H&P Note (Signed)
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  The various methods of treatment have been discussed with the patient and family. After consideration of risks, benefits and other options for treatment, the patient has consented to  Procedure(s): LAPAROSCOPIC CHOLECYSTECTOMY WITH INTRAOPERATIVE CHOLANGIOGRAM AND EXAMINE HIATUS FOR HIATAL HERNIA (N/A) as a surgical intervention.  The patient's history has been reviewed, patient examined, no change in status, stable for surgery.  I have reviewed the patient's chart and labs.  Questions were answered to the patient's satisfaction.     Pedro Earls

## 2020-10-02 ENCOUNTER — Encounter (HOSPITAL_COMMUNITY): Payer: Self-pay | Admitting: Surgery

## 2020-10-04 LAB — SURGICAL PATHOLOGY

## 2021-02-02 IMAGING — CR CHEST - 2 VIEW
2 series · 2 of 2 positions shown · non-contrast
Comparison: 11/11/2018.

CLINICAL DATA: Intermittent chest pain and pressure for the past
week.

EXAM:
CHEST - 2 VIEW

[w chest pa]
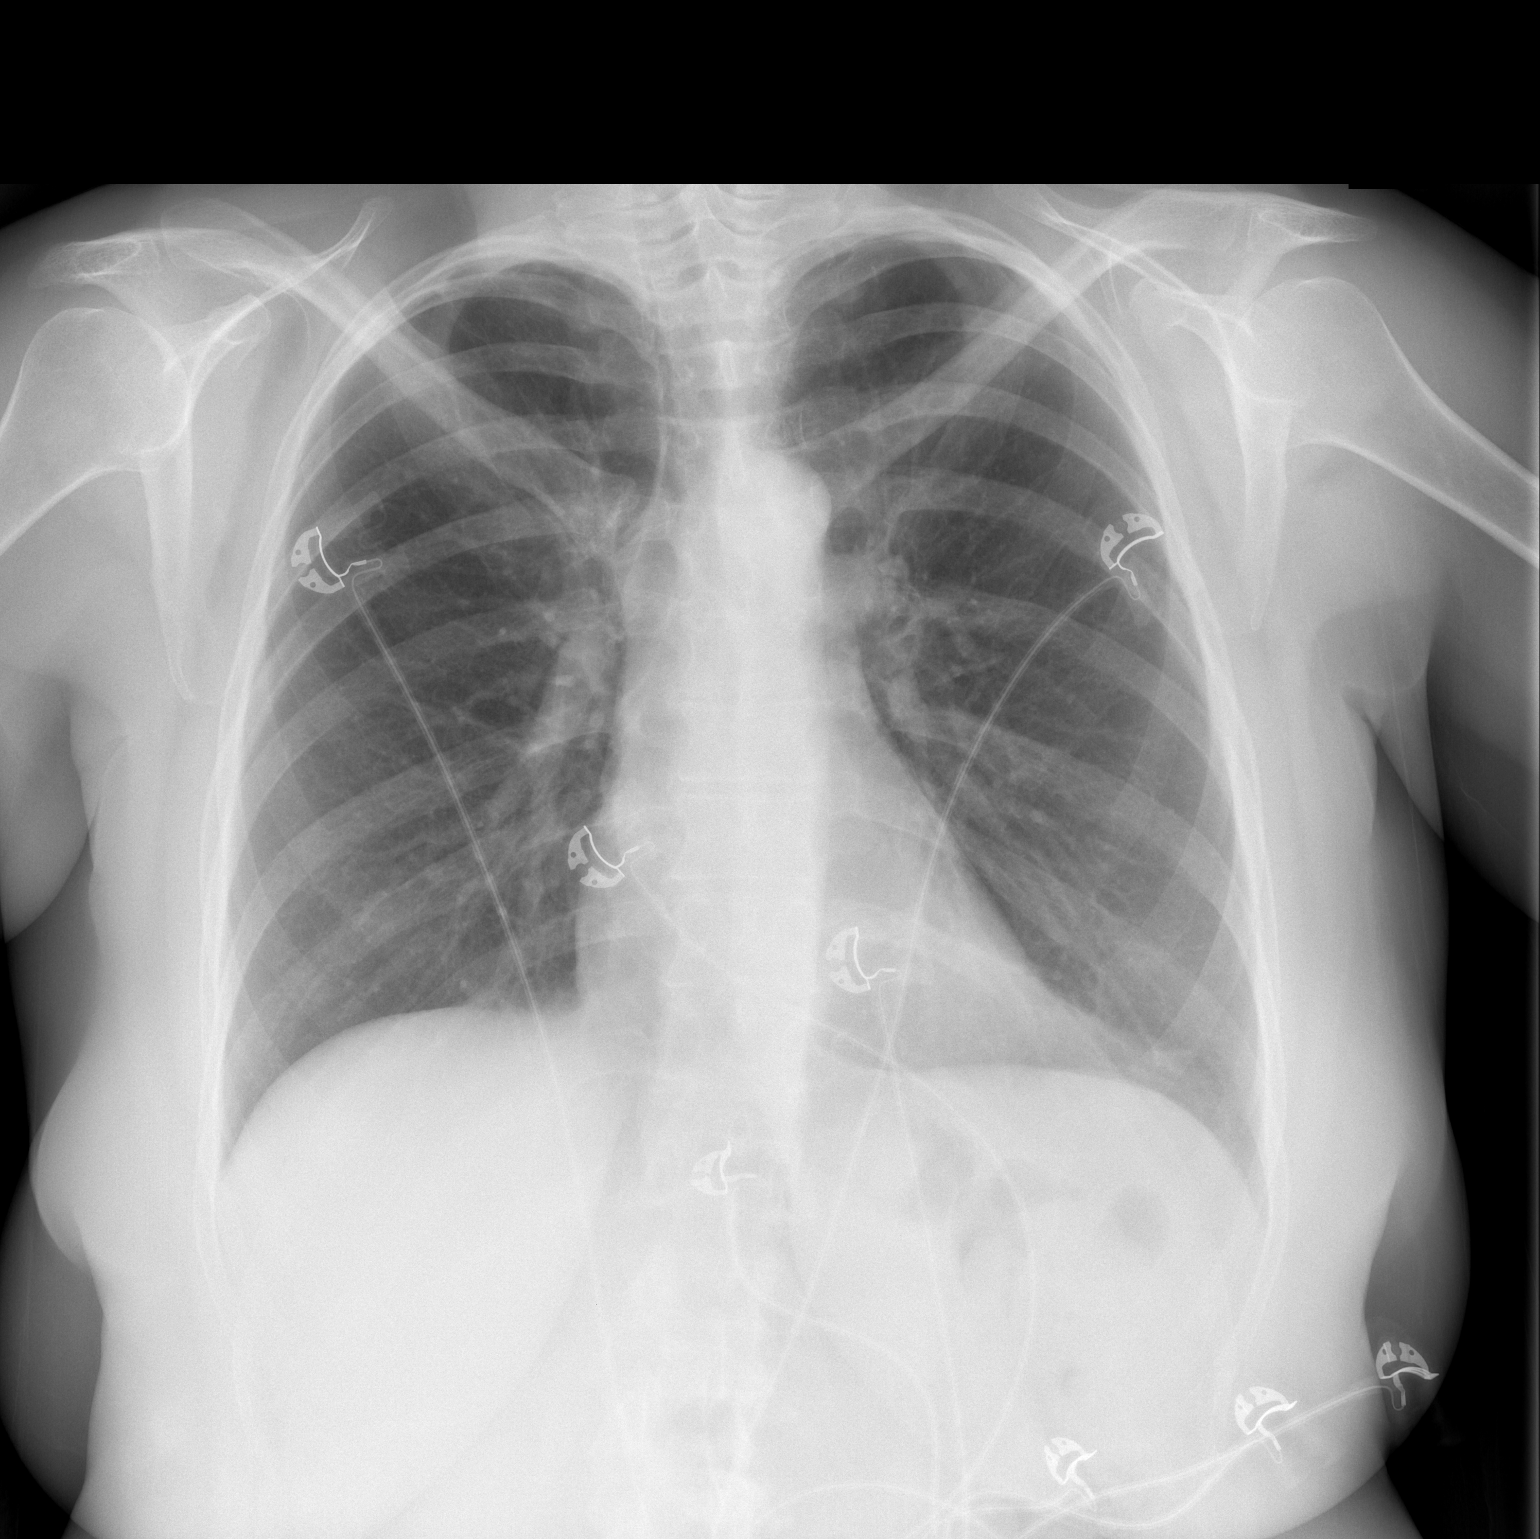

[w chest lat]
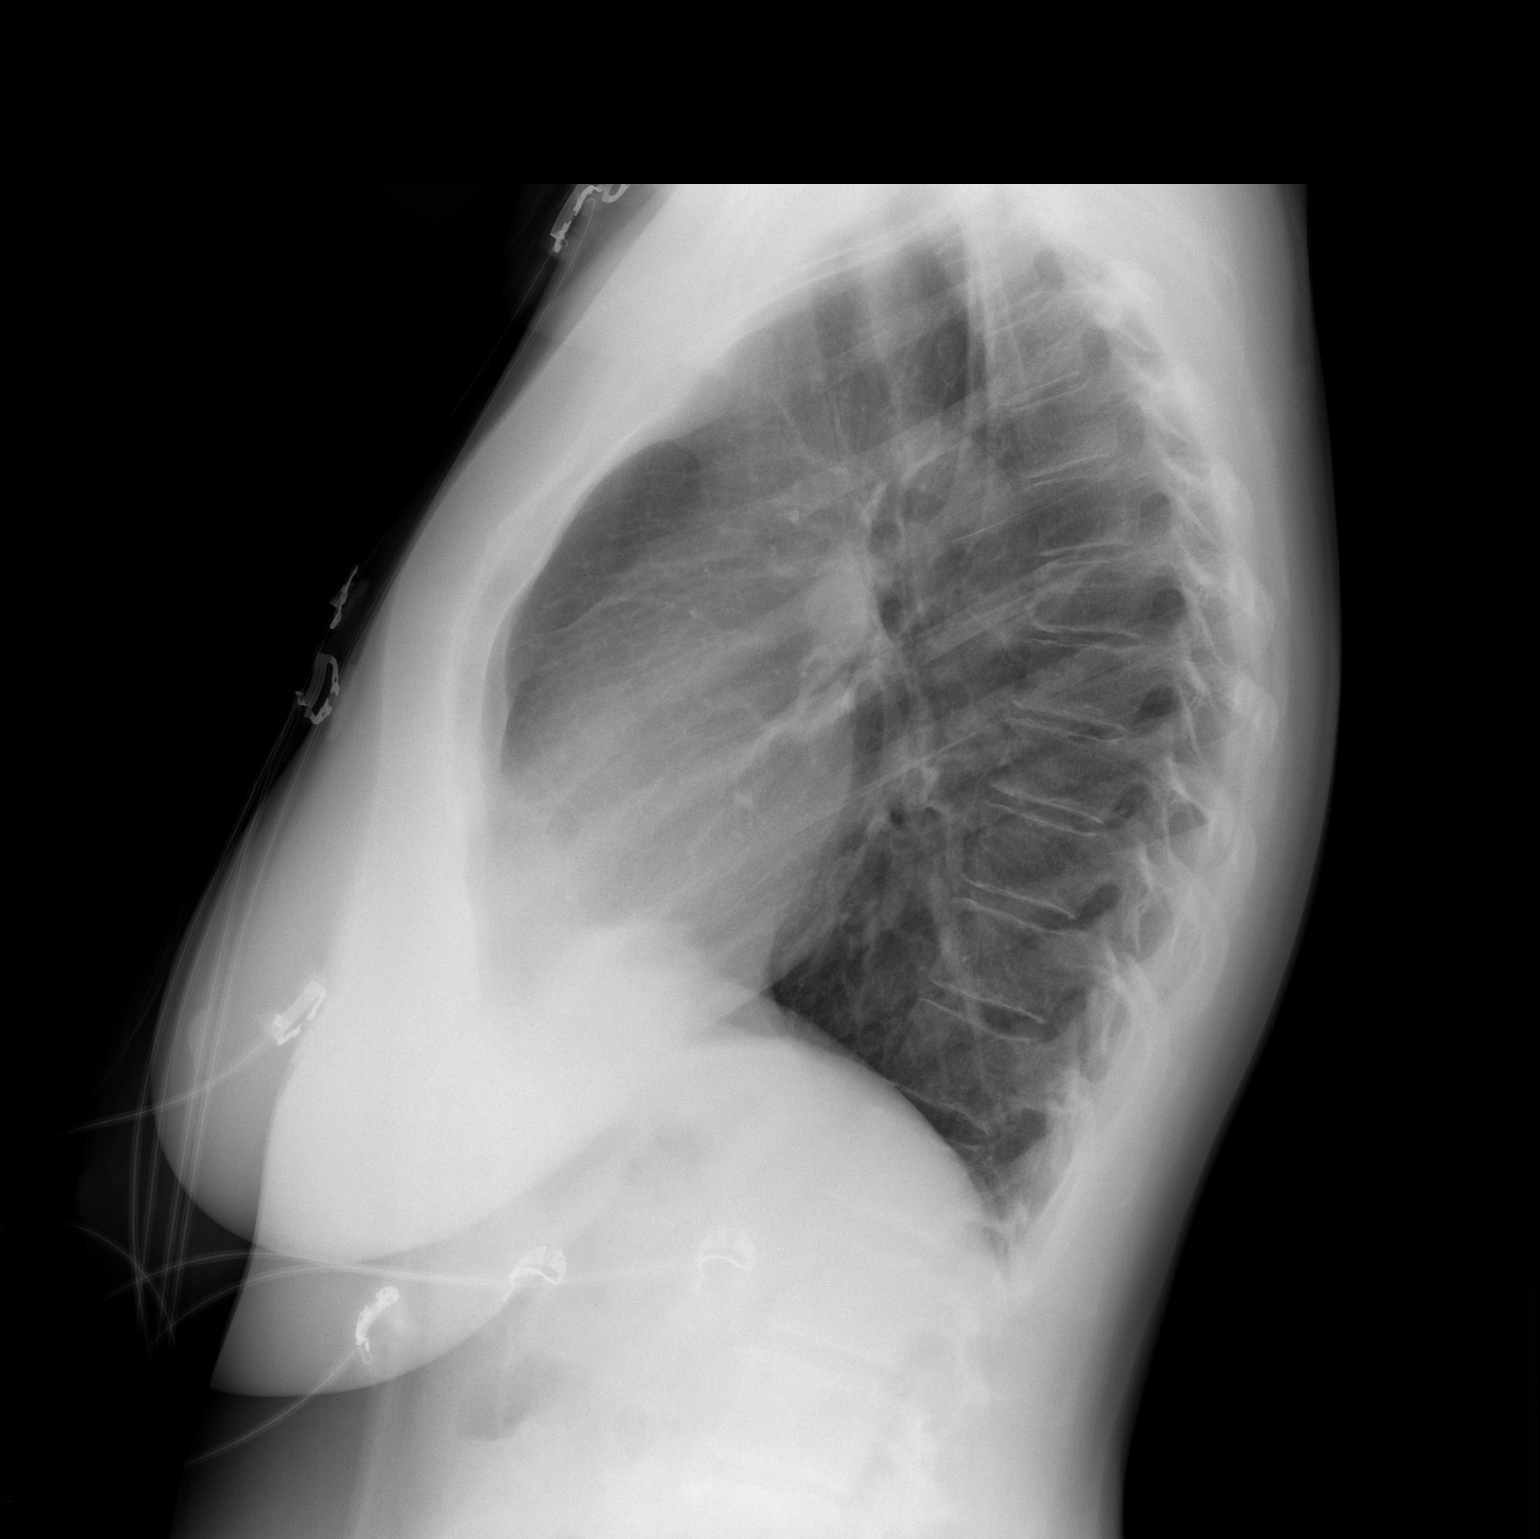

[2 of 2 positions shown; findings below may reference images not displayed]

FINDINGS: Normal sized heart. Clear lungs with normal vascularity. Minimal
thoracic spine degenerative changes and minimal scoliosis.
IMPRESSION: No acute abnormality.

## 2021-05-31 ENCOUNTER — Other Ambulatory Visit: Payer: Self-pay

## 2021-05-31 ENCOUNTER — Emergency Department (HOSPITAL_BASED_OUTPATIENT_CLINIC_OR_DEPARTMENT_OTHER): Payer: Self-pay

## 2021-05-31 ENCOUNTER — Encounter (HOSPITAL_BASED_OUTPATIENT_CLINIC_OR_DEPARTMENT_OTHER): Payer: Self-pay | Admitting: *Deleted

## 2021-05-31 ENCOUNTER — Emergency Department (HOSPITAL_BASED_OUTPATIENT_CLINIC_OR_DEPARTMENT_OTHER)
Admission: EM | Admit: 2021-05-31 | Discharge: 2021-05-31 | Disposition: A | Discharge status: Home / Self Care | Payer: Self-pay | Attending: Emergency Medicine | Admitting: Emergency Medicine

## 2021-05-31 DIAGNOSIS — X501XXA Overexertion from prolonged static or awkward postures, initial encounter: Secondary | ICD-10-CM | POA: Insufficient documentation

## 2021-05-31 DIAGNOSIS — M79671 Pain in right foot: Secondary | ICD-10-CM | POA: Insufficient documentation

## 2021-05-31 DIAGNOSIS — Z8541 Personal history of malignant neoplasm of cervix uteri: Secondary | ICD-10-CM | POA: Insufficient documentation

## 2021-05-31 NOTE — ED Notes (Signed)
Patient reports worsening right heel and instep pain after original injury 1 week ago.

## 2021-05-31 NOTE — Discharge Instructions (Addendum)
Your x-ray did not show any fractures or abnormalities today.  You have been given crutches and a boot to utilize at work if needed.  You may continue to utilize ibuprofen and may also use Tylenol as well.  Ice may help your inflammation.  You may also use heat if it makes you feel better. Return if you begin to have numbness or tingling in your feet or are unable to move your ankle or toes.  Call the attached orthopedic office to follow-up on your symptoms.  They may be able to better assess you for plantar fasciitis.

## 2021-05-31 NOTE — ED Triage Notes (Signed)
A week ago she fell on her right heel. Her leg gave out due to shooting pain in her heel. Now she has a knot on top of her foot.

## 2021-05-31 NOTE — ED Provider Notes (Signed)
[facility] EMERGENCY DEPARTMENT Provider Note   CSN: [id number] Arrival date & time: 05/31/21  1636     History Chief Complaint  Patient presents with   Foot Injury    Rita Miranda is a 55 y.o. female with a past medical history of chest pain and GERD presenting today with a complaint of right foot pain.  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  She says she had a shooting pain at this time.  One of her coworkers told her that she may have plantar fasciitis so patient purchase a boot on [place] that did not seem to help her.  Today she is here with a complaint of worsening pain and some inflammation along her foot.  States that when she was walking earlier she felt a pop along the medial aspect of her foot.  Able to bear weight however uncomfortable.  Reports that the pain is worse after long days work.   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   Chest pain in adult [id number]   Complication of anesthesia    GERD (gastroesophageal reflux disease)    PONV (postoperative nausea and vomiting)    Sinus bradycardia 03/17/2019    Patient Active Problem List   Diagnosis Date Noted   Status post laparoscopic cholecystectomyJan 2022 10/01/2020   Palpitations 05/27/2019   Sinus bradycardia 03/17/2019   Chest pain in adult 11/11/2018    Past Surgical History:  Procedure Laterality Date   ABDOMINAL HYSTERECTOMY     CHOLECYSTECTOMY N/A 10/01/2020   Procedure: LAPAROSCOPIC CHOLECYSTECTOMY WITH ATTEMPTED INTRAOPERATIVE CHOLANGIOGRAM AND EXAMINE HIATUS FOR HIATAL HERNIA;  Surgeon: Johnathan Hausen, MD;  Location: [facility];  Service: General;  Laterality: N/A;   EYE SURGERY     HERNIA REPAIR     LAPAROSCOPIC GASTRIC SLEEVE RESECTION WITH HIATAL HERNIA REPAIR       OB History   No obstetric history on file.     Family History  Problem Relation Age of  Onset   Heart disease Mother    AAA (abdominal aortic aneurysm) Mother    Coronary artery disease Brother    Stroke Sister    Colon cancer Neg Hx    Stomach cancer Neg Hx    Esophageal cancer Neg Hx    Pancreatic cancer Neg Hx     Social History   Tobacco Use   Smoking status: Never   Smokeless tobacco: Never  Vaping Use   Vaping Use: Never used  Substance Use Topics   Alcohol use: No   Drug use: No    Home Medications Prior to Admission medications   Medication Sig Start Date End Date Taking? Authorizing Provider  esomeprazole (NEXIUM) 20 MG capsule Take 20 mg by mouth 2 (two) times daily before a meal.   Yes [provider]  famotidine (PEPCID) 20 MG tablet TAKE 1 TABLET BY MOUTH TWICE DAILY Patient taking differently: Take 20 mg by mouth 2 (two) times daily. 08/09/20  Yes Saguier, Percell Miller, PA-C  sucralfate (CARAFATE) 1 g tablet TAKE 1 TABLET BY MOUTH THREE TIMES DAILY Patient taking differently: Take 1 g by mouth in the morning, at noon, and at bedtime. TAKE 1 TABLET BY MOUTH THREE TIMES DAILY 09/06/20 09/06/21 Yes Saguier, Percell Miller, PA-C    Allergies    Other  Review of Systems  Review of Systems  Respiratory:  Negative for shortness of breath.   Cardiovascular:  Negative for chest pain and palpitations.  Musculoskeletal:  Positive for joint swelling. Negative for back pain and gait problem.  Skin:  Negative for color change and wound.  Neurological:  Negative for dizziness, syncope, weakness, light-headedness and headaches.  All other systems reviewed and are negative.  Physical Exam Updated Vital Signs BP (!) 120/91 (BP Location: Right Arm)   Pulse 73   Temp 98.2 F (36.8 C) (Oral)   Resp 18   Ht '5\' 8"'$  (1.727 m)   Wt 77.1 kg   SpO2 95%   BMI 25.85 kg/m   Physical Exam Vitals and nursing note reviewed.  Constitutional:      General: She is in acute distress.     Appearance: Normal appearance.  HENT:     Head: Normocephalic and atraumatic.   Eyes:     General: No scleral icterus.    Conjunctiva/sclera: Conjunctivae normal.  Cardiovascular:     Rate and Rhythm: Normal rate and regular rhythm.  Pulmonary:     Effort: Pulmonary effort is normal. No respiratory distress.     Breath sounds: Normal breath sounds.  Musculoskeletal:        General: Tenderness present. No swelling. Normal range of motion.     Comments: Patient not noted to have inflammation.  Good pulses and normal sensation on dorsal right and plantar sides of the foot.  Patient with full range of motion at her MCPs and interphalangeal joints.  Tenderness along medial aspect of her foot anterior to the medial malleolus.  [place] ankle negative  Skin:    General: Skin is warm and dry.     Findings: No bruising or rash.     Comments: Patient without bruising  Neurological:     Mental Status: She is alert.     Motor: No weakness.     Comments: Patient ambulatory.  Psychiatric:        Mood and Affect: Mood normal.    ED Results / Procedures / Treatments   Labs (all labs ordered are listed, but only abnormal results are displayed) Labs Reviewed - No data to display  EKG None  Radiology DG Foot Complete Right  Result Date: 05/31/2021 CLINICAL DATA:  Foot injury EXAM: RIGHT FOOT COMPLETE - 3+ VIEW COMPARISON:  None. FINDINGS: There is no evidence of fracture or dislocation. There is no evidence of arthropathy or other focal bone abnormality. Soft tissues are unremarkable. IMPRESSION: Negative. Electronically Signed   By: Donavan Foil M.D.   On: 05/31/2021 17:58    Procedures Procedures   Medications Ordered in ED Medications - No data to display  ED Course  I have reviewed the triage vital signs and the nursing notes.  Pertinent labs & imaging results that were available during my care of the patient were reviewed by me and considered in my medical decision making (see chart for details).    MDM Rules/Calculators/A&P 55 year old female presenting with  a complaint of right foot pain ongoing from a fall 1 week ago.  Patient reports that she fell onto her right heel and unsuccessfully treated this pain with ibuprofen and an ankle brace.  Today patient presents with a concern of swelling and a new bump along the medial part of her foot.  Patient was tender to the forefoot and calcaneus.  I obtained an x-ray which showed no fractures or abnormalities.  Patient neurovascularly intact and remained so throughout her  time in the emergency department.  She is able to bear weight however reports discomfort.  She is concerned for possible plantar fasciitis and I informed her that we do not do ongoing treatment for this in the emergency department and that my role was to rule out anything life or limb threatening and treat her symptoms until she can follow-up with a specialist.  Patient reports that she does not believe in medication and does not let anything except a boot so that she may return to work.  I am happy to give her a boot and some crutches to use as needed as well as a follow-up with orthopedics to further assess her injury.  Boot applied and patient thankful for the treatment today.  She reports that she will make sure to follow-up and be careful while she is at work.  She will be discharged with instructions.   Final Clinical Impression(s) / ED Diagnoses Final diagnoses:  Right foot pain    Rx / DC Orders Results and diagnoses were explained to the patient. Return precautions discussed in full. Patient had no additional questions and expressed complete understanding.     Darliss Ridgel 05/31/21 1915    Gareth Morgan, MD 06/01/21 2312

## 2021-11-01 ENCOUNTER — Ambulatory Visit (INDEPENDENT_AMBULATORY_CARE_PROVIDER_SITE_OTHER): Payer: BC Managed Care – PPO | Admitting: Medical

## 2021-11-01 VITALS — BP 120/80 | HR 65 | Temp 97.5°F | Resp 16 | Ht 67.0 in | Wt 201.4 lb

## 2021-11-01 DIAGNOSIS — R5383 Other fatigue: Secondary | ICD-10-CM | POA: Diagnosis not present

## 2021-11-01 DIAGNOSIS — R739 Hyperglycemia, unspecified: Secondary | ICD-10-CM

## 2021-11-01 DIAGNOSIS — Z0000 Encounter for general adult medical examination without abnormal findings: Secondary | ICD-10-CM | POA: Diagnosis not present

## 2021-11-01 DIAGNOSIS — Z903 Acquired absence of stomach [part of]: Secondary | ICD-10-CM

## 2021-11-01 DIAGNOSIS — Z1231 Encounter for screening mammogram for malignant neoplasm of breast: Secondary | ICD-10-CM | POA: Diagnosis not present

## 2021-11-01 NOTE — Patient Instructions (Addendum)
For you wellness exam today I have ordered cbc, cmp and  lipid panel.(Future labs to be done fasting when well hydrated.)  For fatigue tsh, t4, b12, b1, iron and vit D  Vaccine declined.  Recommend exercise and healthy diet.  We will let you know lab results as they come in.  Follow up date appointment will be determined after lab review.     For obesity we discussed varied options. With sleeve surgery history do think referral to weight loss specialist good idea. You declined. Alli over the counter option. Ashwanda may be helpful. Wellbutrin, metformin and ozempic other options. Though metformin may cause gi upset and you dont want to use injectable.  If you ever try ashwaganda keep in mind otc. Not fda approved. If you were to use recommend cmp every 3 months.  Ask you investigate who/where [facility] you got colonoscopy. If you find out please update me.    Preventive Care 56-8 Years Old, Female Preventive care refers to lifestyle choices and visits with your health care provider that can promote health and wellness. Preventive care visits are also called wellness exams. What can I expect for my preventive care visit? Counseling Your health care provider may ask you questions about your: Medical history, including: Past medical problems. Family medical history. Pregnancy history. Current health, including: Menstrual cycle. Method of birth control. Emotional well-being. Home life and relationship well-being. Sexual activity and sexual health. Lifestyle, including: Alcohol, nicotine or tobacco, and drug use. Access to firearms. Diet, exercise, and sleep habits. Work and work Statistician. Sunscreen use. Safety issues such as seatbelt and bike helmet use. Physical exam Your health care provider will check your: Height and weight. These may be used to calculate your BMI (body mass index). BMI is a measurement that tells if you are at a healthy weight. Waist circumference. This  measures the distance around your waistline. This measurement also tells if you are at a healthy weight and may help predict your risk of certain diseases, such as type 2 diabetes and high blood pressure. Heart rate and blood pressure. Body temperature. Skin for abnormal spots. What immunizations do I need? Vaccines are usually given at various ages, according to a schedule. Your health care provider will recommend vaccines for you based on your age, medical history, and lifestyle or other factors, such as travel or where you work. What tests do I need? Screening Your health care provider may recommend screening tests for certain conditions. This may include: Lipid and cholesterol levels. Diabetes screening. This is done by checking your blood sugar (glucose) after you have not eaten for a while (fasting). Pelvic exam and Pap test. Hepatitis B test. Hepatitis C test. HIV (human immunodeficiency virus) test. STI (sexually transmitted infection) testing, if you are at risk. Lung cancer screening. Colorectal cancer screening. Mammogram. Talk with your health care provider about when you should start having regular mammograms. This may depend on whether you have a family history of breast cancer. BRCA-related cancer screening. This may be done if you have a family history of breast, ovarian, tubal, or peritoneal cancers. Bone density scan. This is done to screen for osteoporosis. Talk with your health care provider about your test results, treatment options, and if necessary, the need for more tests. Follow these instructions at home: Eating and drinking  Eat a diet that includes fresh fruits and vegetables, whole grains, lean protein, and low-fat dairy products. Take vitamin and mineral supplements as recommended by your health care provider. Do not  drink alcohol if: Your health care provider tells you not to drink. You are pregnant, may be pregnant, or are planning to become pregnant. If  you drink alcohol: Limit how much you have to 0-1 drink a day. Know how much alcohol is in your drink. In the [place] one drink equals one 12 oz bottle of beer (355 mL), one 5 oz glass of wine (148 mL), or one 1 oz glass of hard liquor (44 mL). Lifestyle Brush your teeth every morning and night with fluoride toothpaste. Floss one time each day. Exercise for at least 30 minutes 5 or more days each week. Do not use any products that contain nicotine or tobacco. These products include cigarettes, chewing tobacco, and vaping devices, such as e-cigarettes. If you need help quitting, ask your health care provider. Do not use drugs. If you are sexually active, practice safe sex. Use a condom or other form of protection to prevent STIs. If you do not wish to become pregnant, use a form of birth control. If you plan to become pregnant, see your health care provider for a prepregnancy visit. Take aspirin only as told by your health care provider. Make sure that you understand how much to take and what form to take. Work with your health care provider to find out whether it is safe and beneficial for you to take aspirin daily. Find healthy ways to manage stress, such as: Meditation, yoga, or listening to music. Journaling. Talking to a trusted person. Spending time with friends and family. Minimize exposure to UV radiation to reduce your risk of skin cancer. Safety Always wear your seat belt while driving or riding in a vehicle. Do not drive: If you have been drinking alcohol. Do not ride with someone who has been drinking. When you are tired or distracted. While texting. If you have been using any mind-altering substances or drugs. Wear a helmet and other protective equipment during sports activities. If you have firearms in your house, make sure you follow all gun safety procedures. Seek help if you have been physically or sexually abused. What's next? Visit your health care provider once a year  for an annual wellness visit. Ask your health care provider how often you should have your eyes and teeth checked. Stay up to date on all vaccines. This information is not intended to replace advice given to you by your health care provider. Make sure you discuss any questions you have with your health care provider. Document Revised: 03/09/2021 Document Reviewed: 03/09/2021 Elsevier Patient Education  [place].

## 2021-11-01 NOTE — Progress Notes (Signed)
Subjective:    Patient ID: Rita Miranda, female    DOB: Apr 10, 1966, 56 y.o.   MRN: [id number]  HPI  Last visit more than a year ago. Decided to do wellness exam to get most of visit as has not been in for some time.   Pt in for evaluation. She states over past year she has gained a lot of wait. Since January of last year gained about 30 pounds.   Pt had gastric sleeve 5 years ago and lost a lot weight after surgery. She lost down to 162 pounds. Pt states she is following strict diet. She states eats like a bird. Before her surgery in past weighed 250 lbs.   Pt had partial hysterectomy. She thinks she is post menopausal.     Review of Systems  Constitutional:  Negative for chills, fatigue and fever.  Respiratory:  Negative for cough, chest tightness, shortness of breath and wheezing.   Cardiovascular:  Negative for chest pain and palpitations.  Gastrointestinal:  Negative for abdominal distention, abdominal pain, diarrhea, rectal pain and vomiting.  Genitourinary:  Negative for dysuria, enuresis, flank pain and urgency.  Musculoskeletal:  Negative for back pain.  Skin:  Negative for rash.  Neurological:  Negative for dizziness, weakness, numbness and headaches.  Hematological:  Negative for adenopathy. Does not bruise/bleed easily.  Psychiatric/Behavioral:  Negative for behavioral problems and confusion.     Past Medical History:  Diagnosis Date   Anemia    Bradycardia    Cancer ([facility])    uterine: stage I   Chest pain in adult 4/40/1027   Complication of anesthesia    GERD (gastroesophageal reflux disease)    PONV (postoperative nausea and vomiting)    Sinus bradycardia 03/17/2019     Social History   Socioeconomic History   Marital status: Married    Spouse name: Not on file   Number of children: Not on file   Years of education: Not on file   Highest education level: Not on file  Occupational History   Not on file  Tobacco Use   Smoking status: Never    Smokeless tobacco: Never  Vaping Use   Vaping Use: Never used  Substance and Sexual Activity   Alcohol use: No   Drug use: No   Sexual activity: Not on file  Other Topics Concern   Not on file  Social History Narrative   Not on file   Social Determinants of Health   Financial Resource Strain: Not on file  Food Insecurity: Not on file  Transportation Needs: Not on file  Physical Activity: Not on file  Stress: Not on file  Social Connections: Not on file  Intimate Partner Violence: Not on file    Past Surgical History:  Procedure Laterality Date   ABDOMINAL HYSTERECTOMY     CHOLECYSTECTOMY N/A 10/01/2020   Procedure: LAPAROSCOPIC CHOLECYSTECTOMY WITH ATTEMPTED INTRAOPERATIVE CHOLANGIOGRAM AND EXAMINE HIATUS FOR HIATAL HERNIA;  Surgeon: Johnathan Hausen, MD;  Location: [facility];  Service: General;  Laterality: N/A;   EYE SURGERY     HERNIA REPAIR     LAPAROSCOPIC GASTRIC SLEEVE RESECTION WITH HIATAL HERNIA REPAIR      Family History  Problem Relation Age of Onset   Heart disease Mother    AAA (abdominal aortic aneurysm) Mother    Coronary artery disease Brother    Stroke Sister    Colon cancer Neg Hx    Stomach cancer Neg Hx    Esophageal cancer Neg Hx  Pancreatic cancer Neg Hx     Allergies  Allergen Reactions   Other Hives    seafood    Current Outpatient Medications on File Prior to Visit  Medication Sig Dispense Refill   esomeprazole (NEXIUM) 20 MG capsule Take 20 mg by mouth 2 (two) times daily before a meal.     famotidine (PEPCID) 20 MG tablet TAKE 1 TABLET BY MOUTH TWICE DAILY (Patient taking differently: Take 20 mg by mouth 2 (two) times daily.) 60 tablet 0   sucralfate (CARAFATE) 1 g tablet TAKE 1 TABLET BY MOUTH THREE TIMES DAILY (Patient taking differently: Take 1 g by mouth in the morning, at noon, and at bedtime. TAKE 1 TABLET BY MOUTH THREE TIMES DAILY) 90 tablet 0   No current facility-administered medications on file prior to visit.    BP 120/80  (BP Location: Left Arm, Patient Position: Sitting, Cuff Size: Normal)    Pulse 65    Temp (!) 97.5 F (36.4 C) (Oral)    Resp 16    Ht 5\' 7"  (1.702 m)    Wt 201 lb 6.4 oz (91.4 kg)    SpO2 91%    BMI 31.54 kg/m        Objective:   Physical Exam  General Mental Status- Alert. General Appearance- Not in acute distress.   Skin General: Color- Normal Color. Moisture- Normal Moisture.  Neck Carotid Arteries- Normal color. Moisture- Normal Moisture. No carotid bruits. No JVD.  Chest and Lung Exam Auscultation: Breath Sounds:-Normal.  Cardiovascular Auscultation:Rythm- Regular. Murmurs & Other Heart Sounds:Auscultation of the heart reveals- No Murmurs.  Abdomen Inspection:-Inspeection Normal. Palpation/Percussion:Note:No mass. Palpation and Percussion of the abdomen reveal- Non Tender, Non Distended + BS, no rebound or guarding.  Neurologic Cranial Nerve exam:- CN III-XII intact(No nystagmus), symmetric smile. Strength:- 5/5 equal and symmetric strength both upper and lower extremities.       Assessment & Plan:  For you wellness exam today I have ordered cbc, cmp and  lipid panel.(Future labs to be done fasting when well hydrated.)  For fatigue tsh, t4, b12, b1, iron and vit D  Vaccine declined.  Recommend exercise and healthy diet.  We will let you know lab results as they come in.  Follow up date appointment will be determined after lab review.     For obesity we discussed varied options. With sleeve surgery history do think referral to weight loss specialist good idea. You declined. Alli over the counter option. Ashwanda may be helpful. Wellbutrin, metformin and ozempic other options. Though metformin may cause gi upset and you dont want to use injectable.  If you ever try ashwaganda keep in mind otc. Not fda approved. If you were to use recommend cmp every 3 months.  Ask you investigate who/where [facility] Con[facility] you got colonoscopy. If you find out please update me.     [phone] as did address and discuss weight loss treatment options extensively in addition to wellness.  No vaccines today as pt does not like injection and will decide on labs later.   Mackie Pai, PA-C

## 2021-11-22 ENCOUNTER — Ambulatory Visit (HOSPITAL_BASED_OUTPATIENT_CLINIC_OR_DEPARTMENT_OTHER)
Admission: RE | Admit: 2021-11-22 | Discharge: 2021-11-22 | Disposition: A | Discharge status: Home / Self Care | Payer: BC Managed Care – PPO | Source: Ambulatory Visit | Attending: Medical | Admitting: Medical

## 2021-11-22 ENCOUNTER — Encounter (HOSPITAL_BASED_OUTPATIENT_CLINIC_OR_DEPARTMENT_OTHER): Payer: Self-pay

## 2021-11-22 ENCOUNTER — Other Ambulatory Visit: Payer: Self-pay

## 2021-11-22 DIAGNOSIS — Z1231 Encounter for screening mammogram for malignant neoplasm of breast: Secondary | ICD-10-CM | POA: Diagnosis not present

## 2021-12-15 ENCOUNTER — Other Ambulatory Visit (INDEPENDENT_AMBULATORY_CARE_PROVIDER_SITE_OTHER): Payer: BC Managed Care – PPO

## 2021-12-15 DIAGNOSIS — Z903 Acquired absence of stomach [part of]: Secondary | ICD-10-CM | POA: Diagnosis not present

## 2021-12-15 DIAGNOSIS — R739 Hyperglycemia, unspecified: Secondary | ICD-10-CM

## 2021-12-15 DIAGNOSIS — Z0000 Encounter for general adult medical examination without abnormal findings: Secondary | ICD-10-CM | POA: Diagnosis not present

## 2021-12-15 DIAGNOSIS — R5383 Other fatigue: Secondary | ICD-10-CM

## 2021-12-15 LAB — LIPID PANEL
Cholesterol: 205 mg/dL — ABNORMAL HIGH (ref 0–200)
HDL: 63.5 mg/dL (ref >39.00)
LDL Cholesterol: 125 mg/dL — ABNORMAL HIGH (ref 0–99)
NonHDL: 141.32
Total CHOL/HDL Ratio: 3
Triglycerides: 80 mg/dL (ref 0.0–149.0)
VLDL: 16 mg/dL (ref 0.0–40.0)

## 2021-12-15 LAB — HEMOGLOBIN A1C: Hgb A1c MFr Bld: 5.9 % (ref 4.6–6.5)

## 2021-12-15 LAB — CBC WITH DIFFERENTIAL/PLATELET
Basophils Absolute: 0 10*3/uL (ref 0.0–0.1)
Basophils Relative: 0.5 % (ref 0.0–3.0)
Eosinophils Absolute: 0.1 10*3/uL (ref 0.0–0.7)
Eosinophils Relative: 2.7 % (ref 0.0–5.0)
HCT: 39.4 % (ref 36.0–46.0)
Hemoglobin: 13.1 g/dL (ref 12.0–15.0)
Lymphocytes Relative: 33.2 % (ref 12.0–46.0)
Lymphs Abs: 1.6 10*3/uL (ref 0.7–4.0)
MCHC: 33.2 g/dL (ref 30.0–36.0)
MCV: 96 fl (ref 78.0–100.0)
Monocytes Absolute: 0.5 10*3/uL (ref 0.1–1.0)
Monocytes Relative: 10.4 % (ref 3.0–12.0)
Neutro Abs: 2.5 10*3/uL (ref 1.4–7.7)
Neutrophils Relative %: 53.2 % (ref 43.0–77.0)
Platelets: 236 10*3/uL (ref 150.0–400.0)
RBC: 4.11 Mil/uL (ref 3.87–5.11)
RDW: 13.8 % (ref 11.5–15.5)
WBC: 4.7 10*3/uL (ref 4.0–10.5)

## 2021-12-15 LAB — COMPREHENSIVE METABOLIC PANEL
ALT: 14 U/L (ref 0–35)
AST: 16 U/L (ref 0–37)
Albumin: 4.1 g/dL (ref 3.5–5.2)
Alkaline Phosphatase: 78 U/L (ref 39–117)
BUN: 13 mg/dL (ref 6–23)
CO2: 31 mEq/L (ref 19–32)
Calcium: 9 mg/dL (ref 8.4–10.5)
Chloride: 105 mEq/L (ref 96–112)
Creatinine, Ser: 0.71 mg/dL (ref 0.40–1.20)
GFR: 96.58 mL/min (ref >60.00)
Glucose, Bld: 64 mg/dL — ABNORMAL LOW (ref 70–99)
Potassium: 4.6 mEq/L (ref 3.5–5.1)
Sodium: 141 mEq/L (ref 135–145)
Total Bilirubin: 0.5 mg/dL (ref 0.2–1.2)
Total Protein: 6.4 g/dL (ref 6.0–8.3)

## 2021-12-15 LAB — TSH: TSH: 2.01 u[IU]/mL (ref 0.35–5.50)

## 2021-12-15 LAB — T4, FREE: Free T4: 0.75 ng/dL (ref 0.60–1.60)

## 2021-12-15 LAB — VITAMIN B12: Vitamin B-12: 223 pg/mL (ref 211–911)

## 2021-12-15 LAB — IRON: Iron: 176 ug/dL — ABNORMAL HIGH (ref 42–145)

## 2021-12-20 LAB — VITAMIN D 1,25 DIHYDROXY
Vitamin D 1, 25 (OH)2 Total: 44 pg/mL (ref 18–72)
Vitamin D2 1, 25 (OH)2: <8 pg/mL
Vitamin D3 1, 25 (OH)2: 44 pg/mL

## 2021-12-21 LAB — VITAMIN B1: Vitamin B1 (Thiamine): 16 nmol/L (ref 8–30)

## 2021-12-22 ENCOUNTER — Ambulatory Visit (HOSPITAL_BASED_OUTPATIENT_CLINIC_OR_DEPARTMENT_OTHER)
Admission: RE | Admit: 2021-12-22 | Discharge: 2021-12-22 | Disposition: A | Discharge status: Home / Self Care | Payer: BC Managed Care – PPO | Source: Ambulatory Visit | Attending: Medical | Admitting: Medical

## 2021-12-22 ENCOUNTER — Other Ambulatory Visit (HOSPITAL_BASED_OUTPATIENT_CLINIC_OR_DEPARTMENT_OTHER): Payer: Self-pay

## 2021-12-22 ENCOUNTER — Telehealth: Payer: Self-pay

## 2021-12-22 ENCOUNTER — Ambulatory Visit (INDEPENDENT_AMBULATORY_CARE_PROVIDER_SITE_OTHER): Payer: BC Managed Care – PPO | Admitting: Medical

## 2021-12-22 VITALS — BP 127/74 | HR 66 | Resp 18 | Ht 67.0 in | Wt 204.0 lb

## 2021-12-22 DIAGNOSIS — E785 Hyperlipidemia, unspecified: Secondary | ICD-10-CM

## 2021-12-22 DIAGNOSIS — R739 Hyperglycemia, unspecified: Secondary | ICD-10-CM

## 2021-12-22 DIAGNOSIS — M542 Cervicalgia: Secondary | ICD-10-CM | POA: Insufficient documentation

## 2021-12-22 DIAGNOSIS — E538 Deficiency of other specified B group vitamins: Secondary | ICD-10-CM | POA: Diagnosis not present

## 2021-12-22 DIAGNOSIS — R790 Abnormal level of blood mineral: Secondary | ICD-10-CM

## 2021-12-22 DIAGNOSIS — E669 Obesity, unspecified: Secondary | ICD-10-CM | POA: Diagnosis not present

## 2021-12-22 LAB — IRON: Iron: 171 ug/dL — ABNORMAL HIGH (ref 42–145)

## 2021-12-22 MED ORDER — OZEMPIC (0.25 OR 0.5 MG/DOSE) 2 MG/1.5ML ~~LOC~~ SOPN
0.2500 mg | PEN_INJECTOR | SUBCUTANEOUS | 0 refills | Status: DC
  Filled 2021-12-22: qty 1.5, 42d supply, fill #0

## 2021-12-22 MED ORDER — CYCLOBENZAPRINE HCL 5 MG PO TABS
ORAL_TABLET | ORAL | 1 refills | Status: DC
  Filled 2021-12-22: qty 30, 30d supply, fill #0

## 2021-12-22 NOTE — Patient Instructions (Addendum)
For obesity and desired wt loss rx ozempic 0.25 mg im injection weekly.  ? ?For elevated sugar eat low sugar diet. ? ?B12 deficiency. You prefer oral supplement. Recommend 5000 mcg daily. ? ?High iron level. Repeat today. If elevated will refer to hematologist. ? ?Trapezius strain/neck pain with some radiates down her both arms at times. More on left side. Will get c spine xray. Rx low dose flexeril to use only at night before sleep and can use low dose ibuprofen. ? ?For high cholesterol recommend low cholesterol diet. ? ?Follow up in one month or sooner if needed. ? ? ? ? ? ?

## 2021-12-22 NOTE — Progress Notes (Signed)
? ?Subjective:  ? ? Patient ID: Rita Miranda, female    DOB: 1966-09-18, 56 y.o.   MRN: [id number] ? ?HPI ? ?Pt has history of lower end b12 level. B12 vit deficiency. Pt states in past she had b 12 injections and she states caused ha so she declines b12 injections. Pt prefers oral supplamentation. ? ?Jerrye Bushy and gallstone hx. Pt states her abdomen pain stopped after gallbaldder removed. ? ?Pt is obese. She wants to lose weight. She has failed various attempts. 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 Pt wants to use ozempic. No family history of thyromdellary cancer and no prior pancreatitis. ? ? ? ?High iron recently but low iron I past. Stopped iron supplament one year ago. One month ago was on b12 solution. Pt not sure if this solution had iron. Did review contents and did not see iron later in visit. ? ? ?Bilateral trapezius pain over past 3 weeks. Pt was carrying  cleaning supplies. Then noticed the pain. Some pain running down her left arm ? ? ? ? ? ? ?Review of Systems  ?Constitutional:  Negative for chills, fatigue and fever.  ?HENT:  Negative for dental problem.   ?Respiratory:  Negative for cough, chest tightness, shortness of breath and wheezing.   ?Cardiovascular:  Negative for chest pain and palpitations.  ?Gastrointestinal:  Negative for abdominal pain, anal bleeding, blood in stool, diarrhea and nausea.  ?Genitourinary:  Negative for dysuria.  ?Musculoskeletal:  Negative for back pain, gait problem and neck pain.  ?Skin:  Negative for rash.  ?Neurological:  Negative for dizziness, seizures, light-headedness, numbness and headaches.  ?Hematological:  Negative for adenopathy. Does not bruise/bleed easily.  ?Psychiatric/Behavioral:  Negative for behavioral problems, decreased concentration and dysphoric mood.   ? ? ?Past Medical History:  ?Diagnosis Date  ? Anemia   ? Bradycardia   ? Cancer [facility])   ? uterine: stage I  ? Chest pain in adult 11/11/2018  ? Complication of anesthesia   ? GERD  (gastroesophageal reflux disease)   ? PONV (postoperative nausea and vomiting)   ? Sinus bradycardia 03/17/2019  ? ?  ?Social History  ? ?Socioeconomic History  ? Marital status: Married  ?  Spouse name: Not on file  ? Number of children: Not on file  ? Years of education: Not on file  ? Highest education level: Not on file  ?Occupational History  ? Not on file  ?Tobacco Use  ? Smoking status: Never  ? Smokeless tobacco: Never  ?Vaping Use  ? Vaping Use: Never used  ?Substance and Sexual Activity  ? Alcohol use: No  ? Drug use: No  ? Sexual activity: Not on file  ?Other Topics Concern  ? Not on file  ?Social History Narrative  ? Not on file  ? ?Social Determinants of Health  ? ?Financial Resource Strain: Not on file  ?Food Insecurity: Not on file  ?Transportation Needs: Not on file  ?Physical Activity: Not on file  ?Stress: Not on file  ?Social Connections: Not on file  ?Intimate Partner Violence: Not on file  ? ? ?Past Surgical History:  ?Procedure Laterality Date  ? ABDOMINAL HYSTERECTOMY    ? CHOLECYSTECTOMY N/A 10/01/2020  ? Procedure: LAPAROSCOPIC CHOLECYSTECTOMY WITH ATTEMPTED INTRAOPERATIVE CHOLANGIOGRAM AND EXAMINE HIATUS FOR HIATAL HERNIA;  Surgeon: Johnathan Hausen, MD;  Location: [facility];  Service: General;  Laterality: N/A;  ? EYE SURGERY    ? HERNIA REPAIR    ? LAPAROSCOPIC GASTRIC SLEEVE RESECTION WITH HIATAL HERNIA  REPAIR    ? ? ?Family History  ?Problem Relation Age of Onset  ? Heart disease Mother   ? AAA (abdominal aortic aneurysm) Mother   ? Coronary artery disease Brother   ? Stroke Sister   ? Colon cancer Neg Hx   ? Stomach cancer Neg Hx   ? Esophageal cancer Neg Hx   ? Pancreatic cancer Neg Hx   ? ? ?Allergies  ?Allergen Reactions  ? Other Hives  ?  seafood  ? ? ?Current Outpatient Medications on File Prior to Visit  ?Medication Sig Dispense Refill  ? esomeprazole (NEXIUM) 20 MG capsule Take 20 mg by mouth 2 (two) times daily before a meal.    ? famotidine (PEPCID) 20 MG tablet TAKE 1 TABLET BY  MOUTH TWICE DAILY (Patient taking differently: Take 20 mg by mouth 2 (two) times daily.) 60 tablet 0  ? sucralfate (CARAFATE) 1 g tablet TAKE 1 TABLET BY MOUTH THREE TIMES DAILY (Patient taking differently: Take 1 g by mouth in the morning, at noon, and at bedtime. TAKE 1 TABLET BY MOUTH THREE TIMES DAILY) 90 tablet 0  ? ?No current facility-administered medications on file prior to visit.  ? ? ?BP 127/74   Pulse 66   Resp 18   Ht '5\' 7"'$  (1.702 m)   Wt 204 lb (92.5 kg)   SpO2 97%   BMI 31.95 kg/m?  ?  ?   ?Objective:  ? Physical Exam ? ?General ?Mental Status- Alert. General Appearance- Not in acute distress.  ? ?Skin ?General: Color- Normal Color. Moisture- Normal Moisture. ? ?Neck ?Bilateral trapezius pain on palpation. No mid c spine pain. ? ?Chest and Lung Exam ?Auscultation: ?Breath Sounds:-Normal. ? ?Cardiovascular ?Auscultation:Rythm- Regular. ?Murmurs & Other Heart Sounds:Auscultation of the heart reveals- No Murmurs. ? ?Abdomen ?Inspection:-Inspeection Normal. ?Palpation/Percussion:Note:No mass. Palpation and Percussion of the abdomen reveal- Non Tender, Non Distended + BS, no rebound or guarding. ? ? ?Neurologic ?Cranial Nerve exam:- CN III-XII intact(No nystagmus), symmetric smile. ?Strength:- 5/5 equal and symmetric strength both upper and lower extremities.  ? ? ?   ?Assessment & Plan:  ? ?Patient Instructions  ?For obesity and desired wt loss rx ozempic 0.25 mg im injection weekly.  ? ?For elevated sugar eat low sugar diet. ? ?B12 deficiency. You prefer oral supplament. Recommend 5000 mcg daily. ? ?High iron level. Repeat today. If elevated will refer to hematologist. ? ?Trapezius strain/neck pain with some radiates down her both arms at times. More on left side. Will get c spine xray. Rx low dose flexeril to use only at night before sleep and can use low dose ibuprofen. ? ?For high cholesterol recommend low cholesterol diet. ? ?Follow up in one month or sooner if needed. ? ? ? ? ?   ?Mackie Pai, PA-C  ? ?

## 2021-12-22 NOTE — Telephone Encounter (Signed)
PA for Ozempic not completed, Pt does not have type 2 diabetes.  ?

## 2021-12-23 ENCOUNTER — Other Ambulatory Visit (INDEPENDENT_AMBULATORY_CARE_PROVIDER_SITE_OTHER): Payer: BC Managed Care – PPO

## 2021-12-23 ENCOUNTER — Other Ambulatory Visit (HOSPITAL_BASED_OUTPATIENT_CLINIC_OR_DEPARTMENT_OTHER): Payer: Self-pay

## 2021-12-23 ENCOUNTER — Other Ambulatory Visit: Payer: Self-pay | Admitting: *Deleted

## 2021-12-23 DIAGNOSIS — R790 Abnormal level of blood mineral: Secondary | ICD-10-CM | POA: Diagnosis not present

## 2021-12-23 LAB — FERRITIN: Ferritin: 9.1 ng/mL — ABNORMAL LOW (ref 10.0–291.0)

## 2021-12-23 LAB — TRANSFERRIN: Transferrin: 230 mg/dL (ref 212.0–360.0)

## 2021-12-23 NOTE — Addendum Note (Signed)
Addended by: Anabel Halon on: 12/23/2021 11:12 AM ? ? Modules accepted: Orders ? ?

## 2021-12-26 ENCOUNTER — Other Ambulatory Visit: Payer: Self-pay | Admitting: Family

## 2021-12-26 ENCOUNTER — Other Ambulatory Visit (HOSPITAL_BASED_OUTPATIENT_CLINIC_OR_DEPARTMENT_OTHER): Payer: Self-pay

## 2021-12-26 DIAGNOSIS — D509 Iron deficiency anemia, unspecified: Secondary | ICD-10-CM

## 2021-12-26 NOTE — Telephone Encounter (Signed)
Spoke to the patient informed of denial. ?She will think about referral to weight loss clinic and let PCP know if wants to do. ?

## 2021-12-27 ENCOUNTER — Other Ambulatory Visit (HOSPITAL_BASED_OUTPATIENT_CLINIC_OR_DEPARTMENT_OTHER): Payer: Self-pay

## 2021-12-27 ENCOUNTER — Inpatient Hospital Stay: Payer: BC Managed Care – PPO | Attending: Hematology & Oncology

## 2021-12-27 ENCOUNTER — Other Ambulatory Visit: Payer: Self-pay

## 2021-12-27 ENCOUNTER — Encounter: Payer: Self-pay | Admitting: Family

## 2021-12-27 ENCOUNTER — Inpatient Hospital Stay (HOSPITAL_BASED_OUTPATIENT_CLINIC_OR_DEPARTMENT_OTHER): Payer: BC Managed Care – PPO | Admitting: Family

## 2021-12-27 DIAGNOSIS — D509 Iron deficiency anemia, unspecified: Secondary | ICD-10-CM | POA: Insufficient documentation

## 2021-12-27 DIAGNOSIS — G4730 Sleep apnea, unspecified: Secondary | ICD-10-CM | POA: Diagnosis not present

## 2021-12-27 DIAGNOSIS — K909 Intestinal malabsorption, unspecified: Secondary | ICD-10-CM | POA: Diagnosis not present

## 2021-12-27 DIAGNOSIS — R5383 Other fatigue: Secondary | ICD-10-CM | POA: Diagnosis not present

## 2021-12-27 DIAGNOSIS — R748 Abnormal levels of other serum enzymes: Secondary | ICD-10-CM | POA: Diagnosis not present

## 2021-12-27 DIAGNOSIS — K219 Gastro-esophageal reflux disease without esophagitis: Secondary | ICD-10-CM | POA: Insufficient documentation

## 2021-12-27 DIAGNOSIS — Z79899 Other long term (current) drug therapy: Secondary | ICD-10-CM | POA: Diagnosis not present

## 2021-12-27 LAB — CBC (CANCER CENTER ONLY)
HCT: 39.7 % (ref 36.0–46.0)
Hemoglobin: 13 g/dL (ref 12.0–15.0)
MCH: 31.6 pg (ref 26.0–34.0)
MCHC: 32.7 g/dL (ref 30.0–36.0)
MCV: 96.4 fL (ref 80.0–100.0)
Platelet Count: 239 10*3/uL (ref 150–400)
RBC: 4.12 MIL/uL (ref 3.87–5.11)
RDW: 13.2 % (ref 11.5–15.5)
WBC Count: 4.2 10*3/uL (ref 4.0–10.5)
nRBC: 0 % (ref 0.0–0.2)

## 2021-12-27 LAB — RETICULOCYTES
Immature Retic Fract: 6.4 % (ref 2.3–15.9)
RBC.: 4.13 MIL/uL (ref 3.87–5.11)
Retic Count, Absolute: 48.3 10*3/uL (ref 19.0–186.0)
Retic Ct Pct: 1.2 % (ref 0.4–3.1)

## 2021-12-27 LAB — IRON AND IRON BINDING CAPACITY (CC-WL,HP ONLY)
Iron: 121 ug/dL (ref 28–170)
Saturation Ratios: 37 % — ABNORMAL HIGH (ref 10.4–31.8)
TIBC: 323 ug/dL (ref 250–450)
UIBC: 202 ug/dL (ref 148–442)

## 2021-12-27 LAB — FERRITIN: Ferritin: 12 ng/mL (ref 11–307)

## 2021-12-27 LAB — LACTATE DEHYDROGENASE: LDH: 165 U/L (ref 98–192)

## 2021-12-27 LAB — SAVE SMEAR(SSMR), FOR PROVIDER SLIDE REVIEW

## 2021-12-27 NOTE — Progress Notes (Signed)
Hematology/Oncology Consultation  ? ?Name: Rita Miranda      MRN: [id number]    Location: Room/bed info not found  Date: 12/27/2021 Time:9:01 AM ? ? ?REFERRING PHYSICIAN:  Mackie Pai, PA-C ? ?REASON FOR CONSULT:  Abnormal serum iron level  ?  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 ?HISTORY OF PRESENT ILLNESS: Rita Miranda is a very pleasant 56 yo caucasian female with history of iron deficiency.  ?She had gastric sleeve surgery 6 years ago and is also on Nexium and Pepcid daily for GERD (confirmed with EGD 1 year ago) which can certainly cause malabsorption of iron.  ?No blood loss noted. No abnormal bruising, no petechiae.  ?She is symptomatic with fatigue, weakness, chills and SOB with exertion.  ?Recent ferritin was only 9. Iron studies drawn today and results are pending.  ?No known family history of anemia.  ?She has never had a colonoscopy.  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 She also required blood transfusion after the birth of her first son.  ?She has 3 sons and history of 2 miscarriages at [redacted] weeks gestation.  ?She has history of bradycardia and states that her HR is sometimes in the 50's. HR at this time is 64.  ?She does have sleep apnea.  ?No history of diabetes or thyroid disease.  ?History of uterine cancer, stage I diagnosed 21 years ago. She states that this was treated with hysterectomy. No chemo or radiation.  ?Her maternal uncle had cancer with unknown primary.  ?No fever, n/v, cough, rash, dizziness, chest pain, palpitations, abdominal pain or changes in bowel or bladder habits.  ?No swelling, tenderness, numbness or tingling in his extremities.  ?No falls or syncope reported.  ?No smoking, ETOH or recreational drug use.  ?She has a decreased appetite but notes that she stays well hydrated throughout the day. Her weight is 204 lbs.  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  ?She and her husband are originally from [place] and moved here 4 years ago so her husband could be followed by [facility] for pancreatitis.  ? ?ROS: All other 10 point review of systems is negative.  ? ?PAST MEDICAL HISTORY:   ?Past Medical History:  ?Diagnosis Date  ? Anemia   ? Bradycardia   ? Cancer [facility])   ? uterine: stage I  ? Chest pain in adult 11/11/2018  ? Complication of anesthesia   ? GERD (gastroesophageal reflux disease)   ? PONV (postoperative nausea and vomiting)   ? Sinus bradycardia 03/17/2019  ? ? ?ALLERGIES: ?Allergies  ?Allergen Reactions  ? Other Hives  ?  seafood  ? ?   ?MEDICATIONS:  ?Current Outpatient Medications on File Prior to Visit  ?Medication Sig Dispense Refill  ? cyclobenzaprine (FLEXERIL) 5 MG tablet Take 1 tablet by mouth as needed for neck pain prior to sleep 30 tablet 1  ? esomeprazole (NEXIUM) 20 MG capsule Take 20 mg by mouth 2 (two) times daily before a meal.    ? famotidine (PEPCID) 20 MG tablet TAKE 1 TABLET BY MOUTH TWICE DAILY (Patient taking differently: Take 20 mg by mouth 2 (two) times daily.) 60 tablet 0  ? Semaglutide,0.25 or 0.'5MG'$ /DOS, (OZEMPIC, 0.25 OR 0.5 MG/DOSE,) 2 MG/1.5ML SOPN Inject 0.25 mg into the skin once a week. 3 mL 0  ? sucralfate (CARAFATE) 1 g tablet TAKE 1 TABLET BY MOUTH THREE TIMES DAILY (  Patient taking differently: Take 1 g by mouth in the morning, at noon, and at bedtime. TAKE 1 TABLET BY MOUTH THREE TIMES DAILY) 90 tablet 0  ? ?No current facility-administered medications on file prior to visit.  ? ?  ?PAST SURGICAL HISTORY ?Past Surgical History:  ?Procedure Laterality Date  ? ABDOMINAL HYSTERECTOMY    ? CHOLECYSTECTOMY N/A 10/01/2020  ? Procedure: LAPAROSCOPIC CHOLECYSTECTOMY WITH ATTEMPTED INTRAOPERATIVE CHOLANGIOGRAM AND EXAMINE HIATUS FOR HIATAL HERNIA;  Surgeon: Johnathan Hausen, MD;  Location: [facility];  Service: General;  Laterality: N/A;  ? EYE SURGERY    ? HERNIA REPAIR    ? LAPAROSCOPIC GASTRIC SLEEVE RESECTION WITH HIATAL HERNIA  REPAIR    ? ? ?FAMILY HISTORY: ?Family History  ?Problem Relation Age of Onset  ? Heart disease Mother   ? AAA (abdominal aortic aneurysm) Mother   ? Coronary artery disease Brother   ? Stroke Sister   ? Colon cancer Neg Hx   ? Stomach cancer Neg Hx   ? Esophageal cancer Neg Hx   ? Pancreatic cancer Neg Hx   ? ? ?SOCIAL HISTORY: ? reports that she has never smoked. She has never used smokeless tobacco. She reports that she does not drink alcohol and does not use drugs. ? ?PERFORMANCE STATUS: ?The patient's performance status is 1 - Symptomatic but completely ambulatory ? ?PHYSICAL EXAM: ?Most Recent Vital Signs: There were no vitals taken for this visit. ?There were no vitals taken for this visit. ? ?General Appearance:    Alert, cooperative, no distress, appears stated age  ?Head:    Normocephalic, without obvious abnormality, atraumatic  ?Eyes:    PERRL, conjunctiva/corneas clear, EOM's intact, fundi  ?  benign, both eyes  ?   ?   ?Throat:   Lips, mucosa, and tongue normal; teeth and gums normal  ?Neck:   Supple, symmetrical, trachea midline, no adenopathy;  ?  thyroid:  no enlargement/tenderness/nodules; no carotid ?  bruit or JVD  ?Back:     Symmetric, no curvature, ROM normal, no CVA tenderness  ?Lungs:     Clear to auscultation bilaterally, respirations unlabored  ?Chest Wall:    No tenderness or deformity  ? Heart:    Regular rate and rhythm, S1 and S2 normal, no murmur, rub   or gallop  ?   ?Abdomen:     Soft, non-tender, bowel sounds active all four quadrants,  ?  no masses, no organomegaly  ?   ?   ?Extremities:   Extremities normal, atraumatic, no cyanosis or edema  ?Pulses:   2+ and symmetric all extremities  ?Skin:   Skin color, texture, turgor normal, no rashes or lesions  ?Lymph nodes:   Cervical, supraclavicular, and axillary nodes normal  ?Neurologic:   CNII-XII intact, normal strength, sensation and reflexes  ?  throughout  ? ? ?LABORATORY DATA:  ?Results for orders placed or performed in visit on  12/27/21 (from the past 48 hour(s))  ?Reticulocytes     Status: None  ? Collection Time: 12/27/21  8:24 AM  ?Result Value Ref Range  ? Retic Ct Pct 1.2 0.4 - 3.1 %  ? RBC. 4.13 3.87 - 5.11 MIL/uL  ? Retic Count, Absolute 48.3 19.0 - 186.0 K/uL  ? Immature Retic Fract 6.4 2.3 - 15.9 %  ?  Comment: Performed at [facility] at [facility], [address] [place], [place] [address]  ?CBC (Cancer Center Only)     Status: None  ? Collection  Time: 12/27/21  8:24 AM  ?Result Value Ref Range  ? WBC Count 4.2 4.0 - 10.5 K/uL  ? RBC 4.12 3.87 - 5.11 MIL/uL  ? Hemoglobin 13.0 12.0 - 15.0 g/dL  ? HCT 39.7 36.0 - 46.0 %  ? MCV 96.4 80.0 - 100.0 fL  ? MCH 31.6 26.0 - 34.0 pg  ? MCHC 32.7 30.0 - 36.0 g/dL  ? RDW 13.2 11.5 - 15.5 %  ? Platelet Count 239 150 - 400 K/uL  ? nRBC 0.0 0.0 - 0.2 %  ?  Comment: Performed at [facility] at [facility], [address] [place], [place] [address]  ?   ? ?RADIOGRAPHY: ?No results found.   ?  ?PATHOLOGY: None ? ?ASSESSMENT/PLAN: Ms. Medders is a very pleasant 56 yo caucasian female with history of iron deficiency.  ?Iron studies are pending. We will get her set up for IV iron if needed.  ?Follow-up in 8 weeks.  ? ?All questions were answered. The patient knows to call the clinic with any problems, questions or concerns. We can certainly see the patient much sooner if necessary. ? ? ?Lottie Dawson, NP ?  ? ? ? ? ? ?

## 2021-12-28 ENCOUNTER — Telehealth: Payer: Self-pay

## 2021-12-28 NOTE — Telephone Encounter (Signed)
-----   Message from Celso Amy, NP sent at 12/28/2021 11:30 AM EDT ----- ?Iron saturation is 37% and ferritin 12. At this time she does not needed IV iron. She can do geritol drops (which she can get over the counter) under the tongue once a day. This will absorb through the oral mucosa bypassing the gut and intestinal tract and continue to help her counts improve. We will see her for follow-up in 8 weeks. By then hopefully with the B12 and iron on board we will see an improvement in her symptoms! Thank you! ? ?----- Message ----- ?From: Interface, Lab In [facility] ?Sent: 12/27/2021   8:40 AM EDT ?To: Celso Amy, NP ? ? ?

## 2021-12-28 NOTE — Telephone Encounter (Signed)
Called and informed patient of lab results, patient verbalized understanding and denies any questions or concerns at this time.   

## 2021-12-30 ENCOUNTER — Telehealth: Payer: Self-pay | Admitting: *Deleted

## 2021-12-30 NOTE — Telephone Encounter (Signed)
Per 12/27/21 los - called and gave upcoming appointments - confirmed ?

## 2022-01-05 ENCOUNTER — Telehealth: Payer: Self-pay

## 2022-01-05 NOTE — Telephone Encounter (Signed)
Received message that patient had called saying that every time she took the Geritol drops she vomited after. This RN called patient and patient stated that she has tried to take medication in the AM or PM with no different result. Pt stated she vomits regardless of time of day with medication. Pt states she didn't take it one day to see if it was her B12 medication or the [facility] and she tolerated the B12 medication with no problem. Pt educated that Lottie Dawson, NP is out of the office today but at this time to stop the geritol. Pt educated that once discussed with Lottie Dawson, NP she will be called with next option for oral iron. Pt verbalized understanding and had no further questions.  ?

## 2022-01-23 ENCOUNTER — Ambulatory Visit: Payer: BC Managed Care – PPO | Admitting: Medical

## 2022-02-27 ENCOUNTER — Inpatient Hospital Stay: Payer: BC Managed Care – PPO | Attending: Hematology & Oncology

## 2022-02-27 ENCOUNTER — Other Ambulatory Visit (HOSPITAL_BASED_OUTPATIENT_CLINIC_OR_DEPARTMENT_OTHER): Payer: Self-pay

## 2022-02-27 ENCOUNTER — Ambulatory Visit (INDEPENDENT_AMBULATORY_CARE_PROVIDER_SITE_OTHER): Payer: BC Managed Care – PPO | Admitting: Medical

## 2022-02-27 ENCOUNTER — Inpatient Hospital Stay (HOSPITAL_BASED_OUTPATIENT_CLINIC_OR_DEPARTMENT_OTHER): Payer: BC Managed Care – PPO | Admitting: Family

## 2022-02-27 ENCOUNTER — Other Ambulatory Visit: Payer: Self-pay

## 2022-02-27 ENCOUNTER — Encounter: Payer: Self-pay | Admitting: Family

## 2022-02-27 VITALS — BP 116/80 | HR 75 | Resp 18 | Ht 67.0 in | Wt 200.8 lb

## 2022-02-27 VITALS — BP 124/86 | HR 60 | Temp 97.6°F | Resp 18 | Ht 67.0 in | Wt 200.1 lb

## 2022-02-27 DIAGNOSIS — D509 Iron deficiency anemia, unspecified: Secondary | ICD-10-CM

## 2022-02-27 DIAGNOSIS — E669 Obesity, unspecified: Secondary | ICD-10-CM

## 2022-02-27 DIAGNOSIS — K909 Intestinal malabsorption, unspecified: Secondary | ICD-10-CM | POA: Insufficient documentation

## 2022-02-27 DIAGNOSIS — E538 Deficiency of other specified B group vitamins: Secondary | ICD-10-CM | POA: Diagnosis not present

## 2022-02-27 DIAGNOSIS — D508 Other iron deficiency anemias: Secondary | ICD-10-CM

## 2022-02-27 LAB — CBC WITH DIFFERENTIAL (CANCER CENTER ONLY)
Abs Immature Granulocytes: 0.06 10*3/uL (ref 0.00–0.07)
Basophils Absolute: 0 10*3/uL (ref 0.0–0.1)
Basophils Relative: 1 %
Eosinophils Absolute: 0.1 10*3/uL (ref 0.0–0.5)
Eosinophils Relative: 2 %
HCT: 39.7 % (ref 36.0–46.0)
Hemoglobin: 13.1 g/dL (ref 12.0–15.0)
Immature Granulocytes: 1 %
Lymphocytes Relative: 34 %
Lymphs Abs: 2.3 10*3/uL (ref 0.7–4.0)
MCH: 31.2 pg (ref 26.0–34.0)
MCHC: 33 g/dL (ref 30.0–36.0)
MCV: 94.5 fL (ref 80.0–100.0)
Monocytes Absolute: 0.6 10*3/uL (ref 0.1–1.0)
Monocytes Relative: 9 %
Neutro Abs: 3.6 10*3/uL (ref 1.7–7.7)
Neutrophils Relative %: 53 %
Platelet Count: 253 10*3/uL (ref 150–400)
RBC: 4.2 MIL/uL (ref 3.87–5.11)
RDW: 12.7 % (ref 11.5–15.5)
WBC Count: 6.7 10*3/uL (ref 4.0–10.5)
nRBC: 0 % (ref 0.0–0.2)

## 2022-02-27 LAB — RETICULOCYTES
Immature Retic Fract: 8 % (ref 2.3–15.9)
RBC.: 4.2 MIL/uL (ref 3.87–5.11)
Retic Count, Absolute: 48.7 10*3/uL (ref 19.0–186.0)
Retic Ct Pct: 1.2 % (ref 0.4–3.1)

## 2022-02-27 LAB — IRON AND IRON BINDING CAPACITY (CC-WL,HP ONLY)
Iron: 128 ug/dL (ref 28–170)
Saturation Ratios: 38 % — ABNORMAL HIGH (ref 10.4–31.8)
TIBC: 333 ug/dL (ref 250–450)
UIBC: 205 ug/dL (ref 148–442)

## 2022-02-27 LAB — FERRITIN: Ferritin: 13 ng/mL (ref 11–307)

## 2022-02-27 MED ORDER — WEGOVY 0.25 MG/0.5ML ~~LOC~~ SOAJ
0.2500 mg | SUBCUTANEOUS | 0 refills | Status: DC
  Filled 2022-02-27: qty 2, 28d supply, fill #0

## 2022-02-27 NOTE — Patient Instructions (Addendum)
For obesity with bmi above 30 recommend diet, exercise and prescribe wegovy 0.25 mg weekly injection. Hopefully with obesity dx/bmi above 30 will be covered.  For iron deficiency and  mild fatigue now.  Follow up with hematologist.  Low b12. Energy improved with 5000 mcg daily.  Follow up in 2 month or sooner if needed

## 2022-02-27 NOTE — Progress Notes (Signed)
Subjective:    Patient ID: Rita Miranda, female    DOB: [birth year], 56 y.o.   MRN: [id number]  HPI  Pt in for follow up.  Pt is obese. I had written ozempic and was not covered. 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 Pt last a1c was 5.9. I had offered weight loss management but pt states she does not have time for that.   Pt had history of gastric sleeve in the past.  Bmi 31.   Pt has hx of low iron. Hematologist plan below.  "ASSESSMENT/PLAN: Rita Miranda is a very pleasant 56 yo caucasian female with history of iron deficiency.  Iron studies are pending. We will get her set up for IV iron if needed.  Follow-up in 8 weeks.    All questions were answered. The patient knows to call the clinic with any problems, questions or concerns. We can certainly see the patient much sooner if necessary."    Oral iron in past upset her stomach.    Energy/much less fatigue with oral b12 5000 mcg daily dose.     Review of Systems  Constitutional:  Positive for fatigue.       Much less fatigue after starting b12 otc.  Respiratory:  Negative for cough, chest tightness, shortness of breath and wheezing.   Cardiovascular:  Negative for chest pain and palpitations.  Musculoskeletal:  Negative for back pain.  Hematological:  Negative for adenopathy. Does not bruise/bleed easily.  Psychiatric/Behavioral:  Negative for behavioral problems, confusion, decreased concentration and dysphoric mood.     Past Medical History:  Diagnosis Date   Anemia    Bradycardia    Cancer (HCC)    uterine: stage I   Chest pain in adult 5/95/6387   Complication of anesthesia    GERD (gastroesophageal reflux disease)    PONV (postoperative nausea and vomiting)    Sinus bradycardia 03/17/2019     Social History   Socioeconomic History   Marital status: Married    Spouse name: Not on file   Number of children: Not on file   Years of education: Not on file    Highest education level: Not on file  Occupational History   Not on file  Tobacco Use   Smoking status: Never   Smokeless tobacco: Never  Vaping Use   Vaping Use: Never used  Substance and Sexual Activity   Alcohol use: No   Drug use: No   Sexual activity: Not on file  Other Topics Concern   Not on file  Social History Narrative   Not on file   Social Determinants of Health   Financial Resource Strain: Not on file  Food Insecurity: Not on file  Transportation Needs: Not on file  Physical Activity: Not on file  Stress: Not on file  Social Connections: Not on file  Intimate Partner Violence: Not on file    Past Surgical History:  Procedure Laterality Date   ABDOMINAL HYSTERECTOMY     CHOLECYSTECTOMY N/A 10/01/2020   Procedure: LAPAROSCOPIC CHOLECYSTECTOMY WITH ATTEMPTED INTRAOPERATIVE CHOLANGIOGRAM AND EXAMINE HIATUS FOR HIATAL HERNIA;  Surgeon: Johnathan Hausen, MD;  Location: [facility];  Service: General;  Laterality: N/A;   EYE SURGERY     HERNIA REPAIR     LAPAROSCOPIC GASTRIC SLEEVE RESECTION WITH HIATAL HERNIA REPAIR      Family History  Problem Relation Age of Onset   Heart disease Mother    AAA (abdominal aortic aneurysm) Mother  Coronary artery disease Brother    Stroke Sister    Colon cancer Neg Hx    Stomach cancer Neg Hx    Esophageal cancer Neg Hx    Pancreatic cancer Neg Hx     Allergies  Allergen Reactions   Other Hives    seafood    Current Outpatient Medications on File Prior to Visit  Medication Sig Dispense Refill   esomeprazole (NEXIUM) 20 MG capsule Take 20 mg by mouth 2 (two) times daily before a meal.     famotidine (PEPCID) 20 MG tablet TAKE 1 TABLET BY MOUTH TWICE DAILY (Patient taking differently: Take 20 mg by mouth 2 (two) times daily.) 60 tablet 0   vitamin B-12 (CYANOCOBALAMIN) 500 MCG tablet Take 500 mcg by mouth daily.     No current facility-administered medications on file prior to visit.    BP 116/80   Pulse 75   Resp  18   Ht '5\' 7"'$  (1.702 m)   Wt 200 lb 12.8 oz (91.1 kg)   SpO2 100%   BMI 31.45 kg/m       Objective:   Physical Exam  General Mental Status- Alert. General Appearance- Not in acute distress.   Skin General: Color- Normal Color. Moisture- Normal Moisture.  Neck Carotid Arteries- Normal color. Moisture- Normal Moisture. No carotid bruits. No JVD.  Chest and Lung Exam Auscultation: Breath Sounds:-Normal.  Cardiovascular Auscultation:Rythm- Regular. Murmurs & Other Heart Sounds:Auscultation of the heart reveals- No Murmurs.  Abdomen Inspection:-Inspeection Normal. Palpation/Percussion:Note:No mass. Palpation and Percussion of the abdomen reveal- Non Tender, Non Distended + BS, no rebound or guarding.    Neurologic Cranial Nerve exam:- CN III-XII intact(No nystagmus), symmetric smile. Strength:- 5/5 equal and symmetric strength both upper and lower extremities.        Assessment & Plan:   Patient Instructions  For obesity with bmi above 30 recommend diet, exercise and prescribe wegovy 0.25 mg weekly injection. Hopefully with obesity dx/bmi above 30 will be covered.  For iron deficiency and  mild fatigue now.  Follow up with hematologist.  Low b12. Energy improved with 5000 mcg daily.  Follow up in 2 month or sooner if needed   [employer], PA-C

## 2022-02-27 NOTE — Progress Notes (Signed)
Hematology and Oncology Follow Up Visit  Rita Miranda [id number] 1966/03/17 56 y.o. 02/27/2022   Principle Diagnosis:  Iron deficiency anemia secondary to malabsorption   Current Therapy:   Previously on oral iron IV iron as indicated    Interim History:  Rita Miranda is here today for follow-up. She stopped taking her oral iron supplement due to GI upset.  She is currently symptomatic with persistent fatigue and brain fog.  No blood loss noted. No bruising, no petechiae.  No fever, chills, n/v, cough, rash, SOB, chest pain, palpitations, abdominal pain or changes in bowel or bladder habits.  No swelling, tenderness, numbness or tingling in her extremities at this time.  No falls or syncope reported.  Appetite and hydration have been good. Her weight is stable at 200 lbs.   ECOG Performance Status: 1 - Symptomatic but completely ambulatory  Medications:  Allergies as of 02/27/2022       Reactions   Other Hives   seafood        Medication List        Accurate as of February 27, 2022  1:34 PM. If you have any questions, ask your nurse or doctor.          esomeprazole 20 MG capsule Commonly known as: NEXIUM Take 20 mg by mouth 2 (two) times daily before a meal.   famotidine 20 MG tablet Commonly known as: PEPCID TAKE 1 TABLET BY MOUTH TWICE DAILY   vitamin B-12 500 MCG tablet Commonly known as: CYANOCOBALAMIN Take 500 mcg by mouth daily.        Allergies:  Allergies  Allergen Reactions   Other Hives    seafood    Past Medical History, Surgical history, Social history, and Family History were reviewed and updated.  Review of Systems: All other 10 point review of systems is negative.   Physical Exam:  height is '5\' 7"'$  (1.702 m) and weight is 200 lb 1.9 oz (90.8 kg). Her oral temperature is 97.6 F (36.4 C). Her blood pressure is 124/86 and her pulse is 60. Her respiration is 18 and oxygen saturation is 100%.   Wt Readings from Last 3 Encounters:   02/27/22 200 lb 1.9 oz (90.8 kg)  02/27/22 200 lb 12.8 oz (91.1 kg)  12/27/21 204 lb (92.5 kg)    Ocular: Sclerae unicteric, pupils equal, round and reactive to light Ear-nose-throat: Oropharynx clear, dentition fair Lymphatic: No cervical or supraclavicular adenopathy Lungs no rales or rhonchi, good excursion bilaterally Heart regular rate and rhythm, no murmur appreciated Abd soft, nontender, positive bowel sounds MSK no focal spinal tenderness, no joint edema Neuro: non-focal, well-oriented, appropriate affect Breasts: Deferred   Lab Results  Component Value Date   WBC 6.7 02/27/2022   HGB 13.1 02/27/2022   HCT 39.7 02/27/2022   MCV 94.5 02/27/2022   PLT 253 02/27/2022   Lab Results  Component Value Date   FERRITIN 12 12/27/2021   IRON 121 12/27/2021   TIBC 323 12/27/2021   UIBC 202 12/27/2021   IRONPCTSAT 37 (H) 12/27/2021   Lab Results  Component Value Date   RETICCTPCT 1.2 02/27/2022   RBC 4.20 02/27/2022   No results found for: KPAFRELGTCHN, LAMBDASER, KAPLAMBRATIO No results found for: IGGSERUM, IGA, IGMSERUM No results found for: Ronnald Ramp, A1GS, A2GS, Violet Baldy, MSPIKE, SPEI   Chemistry      Component Value Date/Time   NA 141 12/15/2021 0820   K 4.6 12/15/2021 0820   CL 105 12/15/2021 0820  CO2 31 12/15/2021 0820   BUN 13 12/15/2021 0820   CREATININE 0.71 12/15/2021 0820   CREATININE 0.73 06/24/2020 0813      Component Value Date/Time   CALCIUM 9.0 12/15/2021 0820   ALKPHOS 78 12/15/2021 0820   AST 16 12/15/2021 0820   ALT 14 12/15/2021 0820   BILITOT 0.5 12/15/2021 0820       Impression and Plan: Rita Miranda is a very pleasant 56 yo caucasian female with history of iron deficiency.  Iron studies are pending. We will replace with IV infusion if needed.  Follow-up in 3 months.   Lottie Dawson, NP 6/5/20231:34 PM

## 2022-02-28 ENCOUNTER — Other Ambulatory Visit: Payer: Self-pay | Admitting: Family

## 2022-03-02 ENCOUNTER — Telehealth: Payer: Self-pay | Admitting: Medical

## 2022-03-02 NOTE — Telephone Encounter (Signed)
Patient would like a call back to know if her insurance approved the weight loss medication or not, and also to know what the next steps are. Please advise.

## 2022-03-02 NOTE — Telephone Encounter (Signed)
Patient PA for Rita Miranda was denied and an appeal letter was started is there an alternative

## 2022-03-02 NOTE — Telephone Encounter (Signed)
Pt called and lvm to return call 

## 2022-03-06 ENCOUNTER — Inpatient Hospital Stay: Payer: BC Managed Care – PPO

## 2022-03-06 VITALS — BP 136/84 | HR 62 | Temp 97.6°F

## 2022-03-06 DIAGNOSIS — K909 Intestinal malabsorption, unspecified: Secondary | ICD-10-CM | POA: Diagnosis not present

## 2022-03-06 DIAGNOSIS — D508 Other iron deficiency anemias: Secondary | ICD-10-CM | POA: Diagnosis not present

## 2022-03-06 DIAGNOSIS — D509 Iron deficiency anemia, unspecified: Secondary | ICD-10-CM

## 2022-03-06 MED ORDER — DIPHENHYDRAMINE HCL 50 MG/ML IJ SOLN: 50.0000 mg | Freq: Once | INTRAMUSCULAR | Status: DC | PRN

## 2022-03-06 MED ORDER — SODIUM CHLORIDE 0.9 % IV SOLN: Freq: Once | INTRAVENOUS | Status: DC | PRN

## 2022-03-06 MED ORDER — FAMOTIDINE IN NACL 20-0.9 MG/50ML-% IV SOLN: 20.0000 mg | Freq: Once | INTRAVENOUS | Status: DC | PRN

## 2022-03-06 MED ORDER — ALBUTEROL SULFATE (2.5 MG/3ML) 0.083% IN NEBU: 2.5000 mg | INHALATION_SOLUTION | Freq: Once | RESPIRATORY_TRACT | Status: DC | PRN

## 2022-03-06 MED ORDER — EPINEPHRINE 0.3 MG/0.3ML IJ SOAJ: 0.3000 mg | Freq: Once | INTRAMUSCULAR | Status: DC | PRN

## 2022-03-06 MED ORDER — SODIUM CHLORIDE 0.9 % IV SOLN: Freq: Once | INTRAVENOUS | Status: DC

## 2022-03-06 MED ORDER — METHYLPREDNISOLONE SODIUM SUCC 125 MG IJ SOLR: 125.0000 mg | Freq: Once | INTRAMUSCULAR | Status: DC | PRN

## 2022-03-06 MED ORDER — SODIUM CHLORIDE 0.9 % IV SOLN
125.0000 mg | Freq: Once | INTRAVENOUS | Status: AC
  Administered 2022-03-06: 125 mg via INTRAVENOUS
  Filled 2022-03-06: qty 125

## 2022-03-06 NOTE — Patient Instructions (Signed)
Sodium Ferric Gluconate Complex Injection What is this medication? SODIUM FERRIC GLUCONATE COMPLEX (SOE dee um FER ik GLOO koe nate KOM pleks) treats low levels of iron (iron deficiency anemia) in people with kidney disease. Iron is a mineral that plays an important role in making red blood cells, which carry oxygen from your lungs to the rest of your body. This medicine may be used for other purposes; ask your health care provider or pharmacist if you have questions. COMMON BRAND NAME(S): Ferrlecit, Nulecit What should I tell my care team before I take this medication? They need to know if you have any of the following conditions: Anemia that is not from iron deficiency High levels of iron in the blood An unusual or allergic reaction to iron, other medications, foods, dyes, or preservatives Pregnant or are trying to become pregnant Breast-feeding How should I use this medication? This medication is injected into a vein. It is given by your care team in a hospital or clinic setting. Talk to your care team about the use of this medication in children. While it may be prescribed for children as young as 6 years for selected conditions, precautions do apply. Overdosage: If you think you have taken too much of this medicine contact a poison control center or emergency room at once. NOTE: This medicine is only for you. Do not share this medicine with others. What if I miss a dose? It is important not to miss your dose. Call your care team if you are unable to keep an appointment. What may interact with this medication? Do not take this medication with any of the following: Deferasirox Deferoxamine Dimercaprol This medication may also interact with the following: Other iron products This list may not describe all possible interactions. Give your health care provider a list of all the medicines, herbs, non-prescription drugs, or dietary supplements you use. Also tell them if you smoke, drink  alcohol, or use illegal drugs. Some items may interact with your medicine. What should I watch for while using this medication? Your condition will be monitored carefully while you are receiving this medication. Visit your care team for regular checks on your progress. You may need blood work while you are taking this medication. What side effects may I notice from receiving this medication? Side effects that you should report to your care team as soon as possible: Allergic reactions--skin rash, itching, hives, swelling of the face, lips, tongue, or throat Low blood pressure--dizziness, feeling faint or lightheaded, blurry vision Shortness of breath Side effects that usually do not require medical attention (report to your care team if they continue or are bothersome): Flushing Headache Joint pain Muscle pain Nausea Pain, redness, or irritation at injection site This list may not describe all possible side effects. Call your doctor for medical advice about side effects. You may report side effects to FDA at 1-800-FDA-1088. Where should I keep my medication? This medication is given in a hospital or clinic and will not be stored at home. NOTE: This sheet is a summary. It may not cover all possible information. If you have questions about this medicine, talk to your doctor, pharmacist, or health care provider.  2023 Elsevier/Gold Standard (2021-02-04 00:00:00)  

## 2022-03-08 ENCOUNTER — Telehealth: Payer: Self-pay | Admitting: *Deleted

## 2022-03-08 NOTE — Telephone Encounter (Signed)
Called and lvm of upcoming appointments - requested callback to confirm 

## 2022-05-30 ENCOUNTER — Inpatient Hospital Stay: Payer: BC Managed Care – PPO | Admitting: Medical Oncology

## 2022-05-30 ENCOUNTER — Inpatient Hospital Stay: Payer: BC Managed Care – PPO | Attending: Hematology & Oncology

## 2022-06-21 ENCOUNTER — Other Ambulatory Visit: Payer: Self-pay

## 2022-06-21 ENCOUNTER — Emergency Department (HOSPITAL_BASED_OUTPATIENT_CLINIC_OR_DEPARTMENT_OTHER): Payer: Self-pay

## 2022-06-21 ENCOUNTER — Encounter (HOSPITAL_BASED_OUTPATIENT_CLINIC_OR_DEPARTMENT_OTHER): Payer: Self-pay | Admitting: Emergency Medicine

## 2022-06-21 ENCOUNTER — Other Ambulatory Visit (HOSPITAL_BASED_OUTPATIENT_CLINIC_OR_DEPARTMENT_OTHER): Payer: Self-pay

## 2022-06-21 ENCOUNTER — Encounter: Payer: Self-pay | Admitting: Family

## 2022-06-21 ENCOUNTER — Emergency Department (HOSPITAL_BASED_OUTPATIENT_CLINIC_OR_DEPARTMENT_OTHER)
Admission: EM | Admit: 2022-06-21 | Discharge: 2022-06-21 | Disposition: A | Discharge status: Home / Self Care | Payer: Self-pay | Attending: Student | Admitting: Student

## 2022-06-21 DIAGNOSIS — X500XXA Overexertion from strenuous movement or load, initial encounter: Secondary | ICD-10-CM | POA: Insufficient documentation

## 2022-06-21 DIAGNOSIS — Z8542 Personal history of malignant neoplasm of other parts of uterus: Secondary | ICD-10-CM | POA: Insufficient documentation

## 2022-06-21 DIAGNOSIS — Y990 Civilian activity done for income or pay: Secondary | ICD-10-CM | POA: Insufficient documentation

## 2022-06-21 DIAGNOSIS — R001 Bradycardia, unspecified: Secondary | ICD-10-CM | POA: Insufficient documentation

## 2022-06-21 DIAGNOSIS — S43401A Unspecified sprain of right shoulder joint, initial encounter: Secondary | ICD-10-CM | POA: Insufficient documentation

## 2022-06-21 MED ORDER — LIDOCAINE 5 % EX PTCH
1.0000 | MEDICATED_PATCH | CUTANEOUS | Status: DC
Start: 2022-06-21 — End: 2022-06-21
  Administered 2022-06-21: 1 via TRANSDERMAL
  Filled 2022-06-21: qty 1

## 2022-06-21 MED ORDER — NAPROXEN 250 MG PO TABS
500.0000 mg | ORAL_TABLET | Freq: Once | ORAL | Status: AC
Start: 2022-06-21 — End: 2022-06-21
  Administered 2022-06-21: 500 mg via ORAL
  Filled 2022-06-21: qty 2

## 2022-06-21 MED ORDER — LIDOCAINE 5 % EX PTCH
1.0000 | MEDICATED_PATCH | CUTANEOUS | 0 refills | Status: AC
  Filled 2022-06-21: qty 30, 30d supply, fill #0

## 2022-06-21 NOTE — ED Provider Notes (Signed)
[facility] EMERGENCY DEPARTMENT Provider Note  CSN: [id number] Arrival date & time: 06/21/22 0745  Chief Complaint(s) Shoulder Pain  HPI Rita Miranda is a 56 y.o. female with PMH bradycardia who presents emergency department for evaluation of left shoulder pain.  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  She states that she has tried multiple remedies at home including Lidoderm, Tylenol, heat without improvement.  Denies numbness, tingling, weakness of the extremity.  Denies additional trauma to the affected area.  No additional complaints.   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 Authorizing Provider  lidocaine (LIDODERM) 5 % Place 1 patch onto the skin daily. Remove & Discard patch within 12 hours or as directed by MD 06/21/22  Yes Kyriaki Moder, MD  esomeprazole (NEXIUM) 20 MG capsule Take 20 mg by mouth 2 (two) times daily before a meal.    [provider]  famotidine (PEPCID) 20 MG tablet TAKE 1 TABLET BY MOUTH TWICE DAILY Patient taking differently: Take 20 mg by mouth 2 (two) times daily. 08/09/20   Saguier, Percell Miller, PA-C  vitamin B-12 (CYANOCOBALAMIN) 500 MCG tablet Take 500 mcg by mouth daily. 12/27/21   [provider]                                                                                                                                     Past Surgical History Past Surgical History:  Procedure Laterality Date   ABDOMINAL HYSTERECTOMY     CHOLECYSTECTOMY N/A 10/01/2020   Procedure: LAPAROSCOPIC CHOLECYSTECTOMY WITH ATTEMPTED INTRAOPERATIVE CHOLANGIOGRAM AND EXAMINE HIATUS FOR HIATAL HERNIA;  Surgeon: Johnathan Hausen, MD;  Location: [facility];  Service: General;  Laterality: N/A;   EYE SURGERY     HERNIA REPAIR     LAPAROSCOPIC GASTRIC SLEEVE RESECTION WITH HIATAL HERNIA REPAIR     Family History Family History  Problem Relation Age of Onset   Heart disease Mother    AAA (abdominal aortic aneurysm) Mother    Coronary artery disease Brother    Stroke Sister    Colon cancer Neg Hx    Stomach cancer Neg Hx    Esophageal  cancer Neg Hx    Pancreatic cancer Neg Hx     Social History Social History   Tobacco Use   Smoking status: Never   Smokeless tobacco: Never  Vaping Use   Vaping Use: Never used  Substance Use Topics   Alcohol use: No   Drug use: No   Allergies Other  Review of Systems Review of Systems  Musculoskeletal:  Positive for arthralgias.    Physical Exam Vital Signs  I have reviewed the triage vital signs BP 92/60   Pulse (!) 44   Temp (!) 97.5 F (36.4 C)   Resp 18   Ht '5\' 7"'$  (1.702 m)   Wt 90.8 kg   SpO2 100%   BMI 31.35 kg/m   Physical Exam Vitals and nursing note reviewed.  Constitutional:      General: She is not in acute distress.    Appearance: She is well-developed.  HENT:     Head: Normocephalic and atraumatic.  Eyes:     Conjunctiva/sclera: Conjunctivae normal.  Cardiovascular:     Rate and Rhythm: Normal rate and regular rhythm.     Heart sounds: No murmur heard. Pulmonary:     Effort: Pulmonary effort is normal. No respiratory distress.     Breath sounds: Normal breath sounds.  Abdominal:     Palpations: Abdomen is soft.     Tenderness: There is no  abdominal tenderness.  Musculoskeletal:        General: Tenderness present. No swelling.     Cervical back: Neck supple.  Skin:    General: Skin is warm and dry.     Capillary Refill: Capillary refill takes less than 2 seconds.  Neurological:     Mental Status: She is alert.  Psychiatric:        Mood and Affect: Mood normal.     ED Results and Treatments Labs (all labs ordered are listed, but only abnormal results are displayed) Labs Reviewed - No data to display                                                                                                                        Radiology DG Shoulder Left  Result Date: 06/21/2022 CLINICAL DATA:  Left shoulder pain. EXAM: LEFT SHOULDER - 2+ VIEW COMPARISON:  None Available. FINDINGS: Minimal spurring in left glenohumeral joint. Left shoulder is located without acute fracture. Normal alignment at the left [facility] joint. Visualized left ribs are intact. IMPRESSION: 1. No acute bone abnormality to left shoulder. 2. Minimal degenerative changes in left shoulder. Electronically Signed   By: Markus Daft M.D.   On: 06/21/2022 09:01    Pertinent labs & imaging results that were available during my care of the patient were reviewed by me and considered in my medical decision making (see MDM for details).  Medications Ordered in ED Medications  lidocaine (LIDODERM) 5 % 1 patch (1 patch Transdermal Patch Applied 06/21/22 0905)  naproxen (NAPROSYN) tablet 500 mg (500 mg Oral  Given 06/21/22 0905)                                                                                                                                     Procedures Procedures  (including critical care time)  Medical Decision Making / ED Course   This patient presents to the ED for concern of Shoulder pain, this involves an extensive number of treatment options, and is a complaint that carries with it a high risk of complications and morbidity.  The differential diagnosis  includes  muscular strain, fracture, dislocation, ligamentous injury, rotator cuff injury, adhesive capsulitis  MDM: Patient seen emergency room for evaluation of shoulder pain.  Physical exam with tenderness along teres minor and up into the trapezius on the left.  Reproducible pain with empty soda can test.  X-rays with no fracture.  Patient given Naprosyn and a Lidoderm patch and on reevaluation symptoms have significantly improved.  Suspect muscular strain.  Of note, patient bradycardic throughout her stay in the emergency department and this has been extensively worked up in the past and is normal for this patient.  Patient then discharged with outpatient orthopedic follow-up.   Additional history obtained:  -External records from outside source obtained and reviewed including: Chart review including previous notes, labs, imaging, consultation notes     Imaging Studies ordered: I ordered imaging studies including XR shoulder I independently visualized and interpreted imaging. I agree with the radiologist interpretation   Medicines ordered and prescription drug management: Meds ordered this encounter  Medications   naproxen (NAPROSYN) tablet 500 mg   lidocaine (LIDODERM) 5 % 1 patch   lidocaine (LIDODERM) 5 %    Sig: Place 1 patch onto the skin daily. Remove & Discard patch within 12 hours or as directed by MD    Dispense:  30 patch    Refill:  0    -I have reviewed the patients home medicines and have made adjustments as needed  Critical interventions none   Cardiac Monitoring: The patient was maintained on a cardiac monitor.  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

## 2022-06-21 NOTE — ED Triage Notes (Signed)
Pt states she was at work and lifted a vacuum out of a golf cart.  Pt states she started to have left shoulder pain.  She tried heat, muscle relaxants that night.  Seemed to improve slightly.  On Tuesday she lifted the vacuum again and aggravated her shoulder.  Pt states the pain radiated down her left arm and in thoracic area in her back.  Pt states she cannot lift her left arm very well, it causes pain.

## 2022-08-11 ENCOUNTER — Emergency Department (HOSPITAL_BASED_OUTPATIENT_CLINIC_OR_DEPARTMENT_OTHER)
Admission: EM | Admit: 2022-08-11 | Discharge: 2022-08-11 | Disposition: A | Discharge status: Home / Self Care | Payer: Self-pay | Attending: Emergency Medicine | Admitting: Emergency Medicine

## 2022-08-11 ENCOUNTER — Emergency Department (HOSPITAL_BASED_OUTPATIENT_CLINIC_OR_DEPARTMENT_OTHER): Payer: Self-pay

## 2022-08-11 ENCOUNTER — Other Ambulatory Visit: Payer: Self-pay

## 2022-08-11 ENCOUNTER — Encounter (HOSPITAL_BASED_OUTPATIENT_CLINIC_OR_DEPARTMENT_OTHER): Payer: Self-pay | Admitting: Emergency Medicine

## 2022-08-11 DIAGNOSIS — R079 Chest pain, unspecified: Secondary | ICD-10-CM

## 2022-08-11 DIAGNOSIS — R002 Palpitations: Secondary | ICD-10-CM | POA: Insufficient documentation

## 2022-08-11 DIAGNOSIS — R42 Dizziness and giddiness: Secondary | ICD-10-CM | POA: Insufficient documentation

## 2022-08-11 DIAGNOSIS — R0602 Shortness of breath: Secondary | ICD-10-CM | POA: Insufficient documentation

## 2022-08-11 DIAGNOSIS — R519 Headache, unspecified: Secondary | ICD-10-CM | POA: Insufficient documentation

## 2022-08-11 DIAGNOSIS — R0789 Other chest pain: Secondary | ICD-10-CM | POA: Insufficient documentation

## 2022-08-11 LAB — CBC
HCT: 39.6 % (ref 36.0–46.0)
Hemoglobin: 13 g/dL (ref 12.0–15.0)
MCH: 31.6 pg (ref 26.0–34.0)
MCHC: 32.8 g/dL (ref 30.0–36.0)
MCV: 96.4 fL (ref 80.0–100.0)
Platelets: 282 10*3/uL (ref 150–400)
RBC: 4.11 MIL/uL (ref 3.87–5.11)
RDW: 13.2 % (ref 11.5–15.5)
WBC: 7.9 10*3/uL (ref 4.0–10.5)
nRBC: 0 % (ref 0.0–0.2)

## 2022-08-11 LAB — TROPONIN I (HIGH SENSITIVITY): Troponin I (High Sensitivity): <2 ng/L (ref <18)

## 2022-08-11 LAB — BASIC METABOLIC PANEL
Anion gap: 7 (ref 5–15)
BUN: 22 mg/dL — ABNORMAL HIGH (ref 6–20)
CO2: 26 mmol/L (ref 22–32)
Calcium: 9 mg/dL (ref 8.9–10.3)
Chloride: 105 mmol/L (ref 98–111)
Creatinine, Ser: 0.76 mg/dL (ref 0.44–1.00)
GFR, Estimated: >60 mL/min (ref >60)
Glucose, Bld: 108 mg/dL — ABNORMAL HIGH (ref 70–99)
Potassium: 4.3 mmol/L (ref 3.5–5.1)
Sodium: 138 mmol/L (ref 135–145)

## 2022-08-11 LAB — PREGNANCY, URINE: Preg Test, Ur: NEGATIVE

## 2022-08-11 NOTE — ED Provider Notes (Signed)
[facility] EMERGENCY DEPARTMENT Provider Note   CSN: [id number] Arrival date & time: 08/11/22  1549     History  Chief Complaint  Patient presents with   Chest Pain    Rita Miranda is a 56 y.o. female.  Patient with a history of GERD, sinus bradycardia, anemia presenting with episode of palpitations and chest pain.  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  2 days ago she had an episode of racing heart, palpitations, dizziness and lightheadedness which lasted for several hours and resolved.  Today symptoms returned with palpitations, lightheadedness as well as central chest pain.  Chest pain lasted about 2 minutes and has since resolved and not recurred.  Now feeling improved.  No further dizziness or lightheadedness.  No shortness of breath, cough, fever, nausea or vomiting.  She states her blood pressure was checked while she was at work and they said it was high but does not know the number.  Chest pressure was in the center of the chest lasting for 2 minutes.  Does have a history of bradycardia in the past but takes no medications.  Denies any stents in the heart.  Denies any history of IV drug abuse or cancer.  No history of PE.  Good p.o. intake.  No recent vomiting or diarrhea. Gradual onset headache earlier which resolved as well.  The history is provided by the patient.  Chest Pain Associated symptoms: dizziness, headache, palpitations and shortness of breath   Associated symptoms: no abdominal pain, no fatigue, no nausea, no vomiting and no weakness        Home Medications Prior to Admission medications   Medication Sig Start Date End Date Taking? Authorizing Provider  esomeprazole (NEXIUM) 20 MG capsule Take 20 mg by mouth 2 (two) times daily before a meal.    [provider]  famotidine (PEPCID) 20 MG tablet TAKE 1 TABLET BY MOUTH TWICE DAILY Patient taking differently: Take 20 mg by mouth 2 (two) times daily. 08/09/20   Saguier, Percell Miller, PA-C  lidocaine  (LIDODERM) 5 % Place 1 patch onto the skin daily. Remove & Discard patch within 12 hours or as directed by MD 06/21/22   Kommor, Debe Coder, MD  vitamin B-12 (CYANOCOBALAMIN) 500 MCG tablet Take 500 mcg by mouth daily. 12/27/21   [provider]      Allergies    Other    Review of Systems   Review of Systems  Constitutional:  Negative for activity change, appetite change and fatigue.  HENT:  Negative for congestion and nosebleeds.   Eyes:  Negative for visual disturbance.  Respiratory:  Positive for chest tightness and shortness of breath.   Cardiovascular:  Positive for chest pain and palpitations.  Gastrointestinal:  Negative for abdominal pain, nausea and vomiting.  Genitourinary:  Negative for dysuria and hematuria.  Musculoskeletal:  Negative for arthralgias and myalgias.  Neurological:  Positive for dizziness, light-headedness and headaches. Negative for weakness.   all other systems are negative except as noted in the HPI and PMH.    Physical Exam Updated Vital Signs BP 126/84   Pulse 64   Temp 97.8 F (36.6 C) (Oral)   Resp 17   Ht '5\' 8"'$  (1.727 m)   Wt 77.1 kg   SpO2 98%   BMI 25.85 kg/m  Physical Exam Vitals and nursing note reviewed.  Constitutional:      General: She is not in acute distress.    Appearance: She is well-developed.  HENT:  Head: Normocephalic and atraumatic.     Mouth/Throat:     Pharynx: No oropharyngeal exudate.  Eyes:     Conjunctiva/sclera: Conjunctivae normal.     Pupils: Pupils are equal, round, and reactive to light.  Neck:     Comments: No meningismus. Cardiovascular:     Rate and Rhythm: Normal rate and regular rhythm.     Heart sounds: Normal heart sounds. No murmur heard. Pulmonary:     Effort: Pulmonary effort is normal. No respiratory distress.     Breath sounds: Normal breath sounds.  Abdominal:     Palpations: Abdomen is soft.     Tenderness: There is no abdominal tenderness. There is no guarding or rebound.   Musculoskeletal:        General: No tenderness. Normal range of motion.     Cervical back: Normal range of motion and neck supple.  Skin:    General: Skin is warm.  Neurological:     Mental Status: She is alert and oriented to person, place, and time.     Cranial Nerves: No cranial nerve deficit.     Motor: No abnormal muscle tone.     Coordination: Coordination normal.     Comments:  5/5 strength throughout. CN 2-12 intact.Equal grip strength.   Psychiatric:        Behavior: Behavior normal.     ED Results / Procedures / Treatments   Labs (all labs ordered are listed, but only abnormal results are displayed) Labs Reviewed  BASIC METABOLIC PANEL - Abnormal; Notable for the following components:      Result Value   Glucose, Bld 108 (*)    BUN 22 (*)    All other components within normal limits  CBC  PREGNANCY, URINE  D-DIMER, QUANTITATIVE  TROPONIN I (HIGH SENSITIVITY)  TROPONIN I (HIGH SENSITIVITY)    EKG EKG Interpretation  Date/Time:  Friday August 11 2022 16:13:01 EST Ventricular Rate:  68 PR Interval:  162 QRS Duration: 78 QT Interval:  402 QTC Calculation: 427 R Axis:   12 Text Interpretation: Normal sinus rhythm Normal ECG When compared with ECG of 22-Sep-2020 10:17, PREVIOUS ECG IS PRESENT No significant change was found Confirmed by Ezequiel Essex [phone]) on 08/11/2022 8:31:42 PM  Radiology DG Chest 2 View  Result Date: 08/11/2022 CLINICAL DATA:  Chest pain EXAM: CHEST - 2 VIEW COMPARISON:  05/10/19 CXR FINDINGS: No pleural effusion. No pneumothorax. Unchanged cardiac and mediastinal contours. Surgical clips in the right upper quadrant. No focal airspace opacity. No displaced rib fractures. Vertebral body heights are maintained. Degenerative changes of bilateral AC joints. Left basilar atelectasis. IMPRESSION: No radiographic finding to explain chest pain. Electronically Signed   By: Marin Roberts M.D.   On: 08/11/2022 16:44    Procedures Procedures     Medications Ordered in ED Medications - No data to display  ED Course/ Medical Decision Making/ A&P                           Medical Decision Making Amount and/or Complexity of Data Reviewed Labs: ordered. Decision-making details documented in ED Course. Radiology: ordered and independent interpretation performed. Decision-making details documented in ED Course. ECG/medicine tests: ordered and independent interpretation performed. Decision-making details documented in ED Course.   Episode of lightheadedness with palpitations and chest pressure for 2 minutes now resolved.  No difficulty breathing or difficulty swallowing currently.  EKG shows sinus rhythm without acute ST changes.  No cough or  fever.  Low suspicion for ACS.  D-dimer will be obtained.  Troponin negative and chest x-ray negative.  Results reviewed interpreted by me  .Orthostatics are negative chest pain has resolved.  Patient and family refusing second troponin as well as D-dimer.  They are frustrated that they have waited over 5 hours.  She remains chest pain-free and wants to go home.  Discussed with patient we have not ruled out heart attack which can be life-threatening.  She appears to have capacity to make this decision to leave [facility].  Patient will leave [facility] and appears to have capacity to make this decision.  She declined second troponin and declines D-dimer.  She understands that heart attack cannot be ruled out which can be life-threatening.       Final Clinical Impression(s) / ED Diagnoses Final diagnoses:  Nonspecific chest pain    Rx / DC Orders ED Discharge Orders     None         Nyanna Heideman, Annie Main, MD 08/11/22 2143

## 2022-08-11 NOTE — ED Provider Triage Note (Signed)
Emergency Medicine Provider Triage Evaluation Note  Rita Miranda , a 57 y.o. female  was evaluated in triage.  Pt complains of chest pain.  Patient states she had a 2-minute episode of chest pain at work today which prompted her to present to the ED.  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  Did not know what the reading was.  Chest pain was substernal and nonradiating.  Described as a pressure.  Resolved on its own.  Has seen cardiology regarding bradycardia and hypotension in the past and was told no further evaluation or treatment was needed.  Denies shortness of breath,, history of DVT or PE, recent surgery or travel, recent cancer treatment  Review of Systems  Positive: As above Negative: As above  Physical Exam  BP 126/84   Pulse 64   Temp 97.8 F (36.6 C) (Oral)   Resp 17   Ht '5\' 8"'$  (1.727 m)   Wt 77.1 kg   SpO2 98%   BMI 25.85 kg/m  Gen:   Awake, no distress   Resp:  Normal effort  MSK:   Moves extremities without difficulty  Other:  Heart regular rate and rhythm no murmur  Medical Decision Making  Medically screening exam initiated at 6:55 PM.  Appropriate orders placed.  Rita Miranda was informed that the remainder of the evaluation will be completed by another provider, this initial triage assessment does not replace that evaluation, and the importance of remaining in the ED until their evaluation is complete.  Patient is asymptomatic on my evaluation and has been asymptomatic for multiple hours.  Has had multiple normal Bps. ACS rule out   Nehemiah Massed 08/11/22 1857

## 2022-08-11 NOTE — ED Triage Notes (Signed)
Recurrent chest tightness to mid chest , lightheaded , headache , chills , since 3 days ago.

## 2022-08-11 NOTE — ED Notes (Signed)
PT refused to have 2nd Trop D dimer blood drawn. Dr Wyvonnia Dusky to bedside to educate Pt and family on risk associated with not completing her plan of care. Pt and family verbalized complete understanding and denies questions at this time. PT states "I have been here 5 hours and Im going home."

## 2023-02-24 IMAGING — DX DG FOOT COMPLETE 3+V*R*
3 series · 3 of 3 positions shown · non-contrast
Comparison: None.

CLINICAL DATA: Foot injury

EXAM:
RIGHT FOOT COMPLETE - 3+ VIEW

[foot ap]
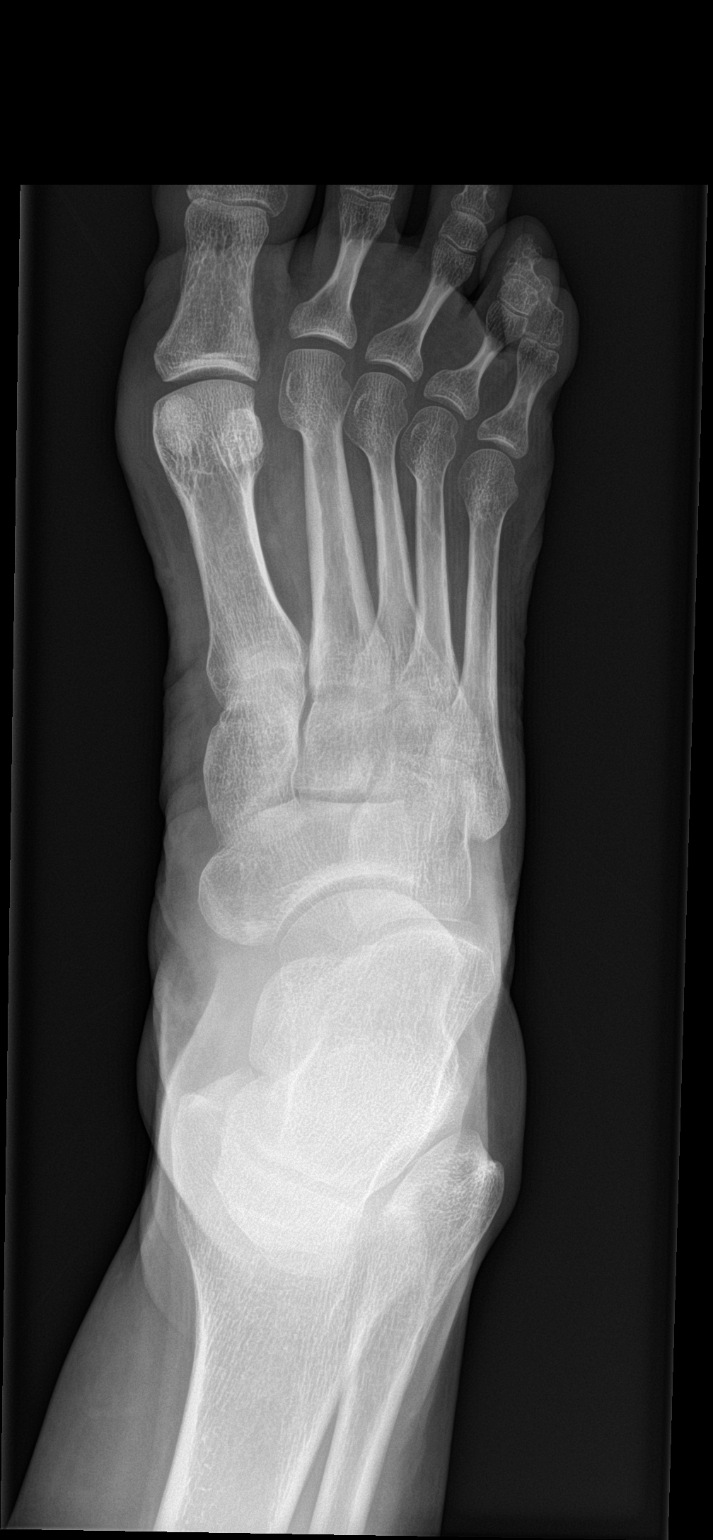

[foot obl]
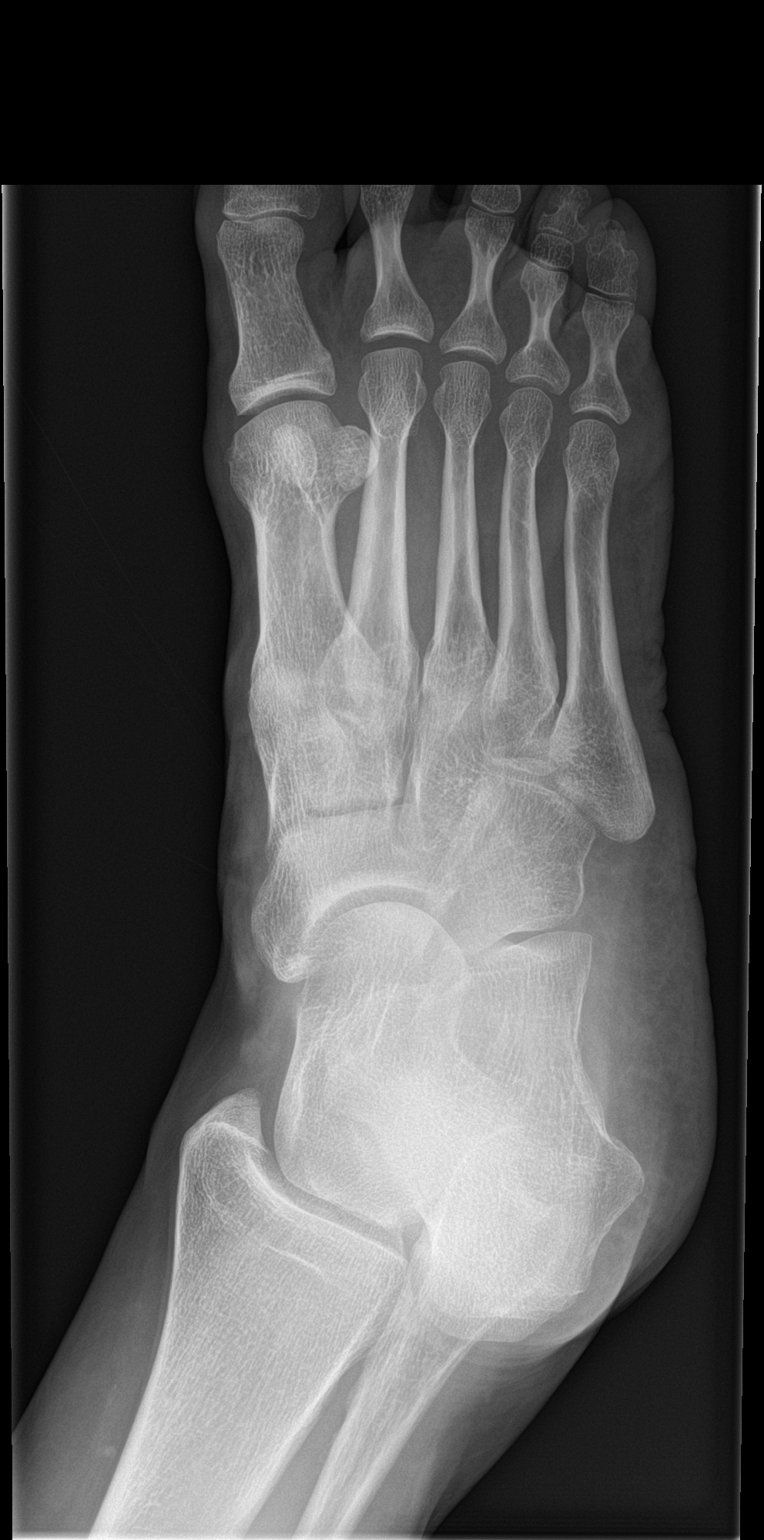

[foot lat]
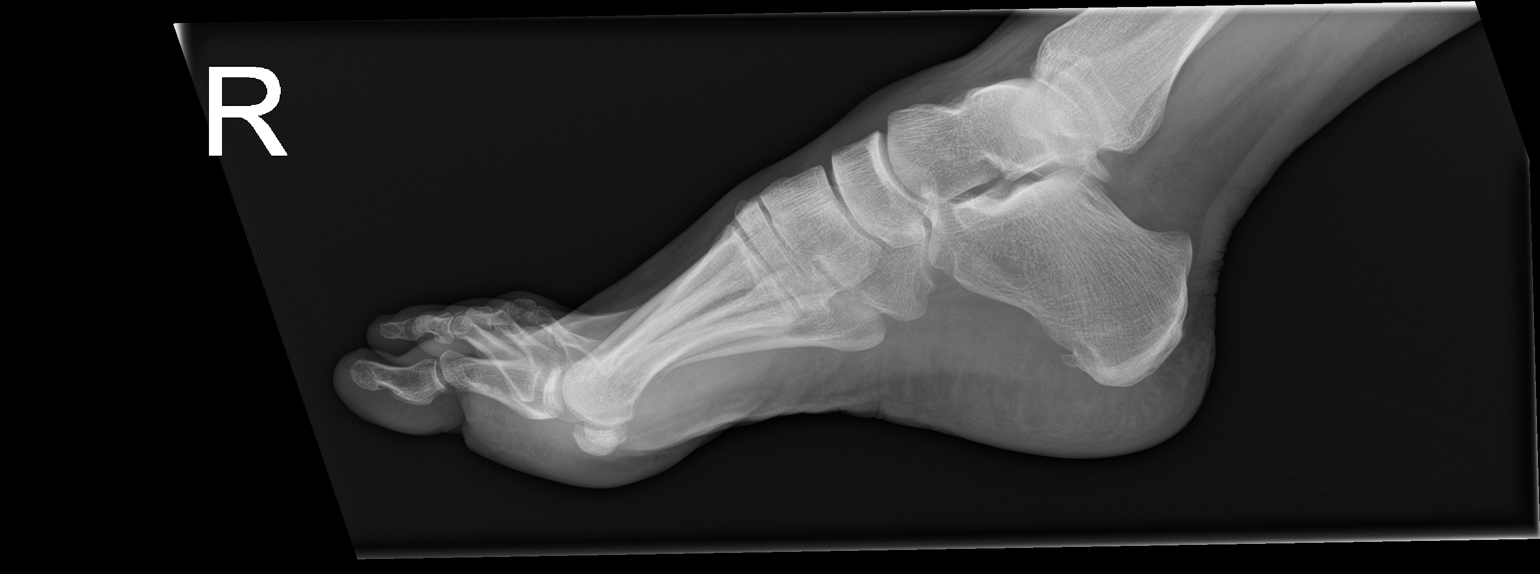

[3 of 3 positions shown; findings below may reference images not displayed]

FINDINGS: There is no evidence of fracture or dislocation. There is no
evidence of arthropathy or other focal bone abnormality. Soft
tissues are unremarkable.
IMPRESSION: Negative.

## 2023-09-21 ENCOUNTER — Encounter: Payer: Self-pay | Admitting: Family

## 2023-09-21 ENCOUNTER — Other Ambulatory Visit (HOSPITAL_BASED_OUTPATIENT_CLINIC_OR_DEPARTMENT_OTHER): Payer: Self-pay

## 2023-09-21 ENCOUNTER — Other Ambulatory Visit: Payer: Self-pay

## 2023-09-21 ENCOUNTER — Emergency Department (HOSPITAL_BASED_OUTPATIENT_CLINIC_OR_DEPARTMENT_OTHER)
Admission: EM | Admit: 2023-09-21 | Discharge: 2023-09-21 | Disposition: A | Discharge status: Home / Self Care | Payer: Self-pay | Attending: Emergency Medicine | Admitting: Emergency Medicine

## 2023-09-21 ENCOUNTER — Encounter (HOSPITAL_BASED_OUTPATIENT_CLINIC_OR_DEPARTMENT_OTHER): Payer: Self-pay

## 2023-09-21 ENCOUNTER — Emergency Department (HOSPITAL_BASED_OUTPATIENT_CLINIC_OR_DEPARTMENT_OTHER): Payer: Self-pay

## 2023-09-21 DIAGNOSIS — Z20822 Contact with and (suspected) exposure to covid-19: Secondary | ICD-10-CM | POA: Insufficient documentation

## 2023-09-21 DIAGNOSIS — Z8542 Personal history of malignant neoplasm of other parts of uterus: Secondary | ICD-10-CM | POA: Insufficient documentation

## 2023-09-21 DIAGNOSIS — J209 Acute bronchitis, unspecified: Secondary | ICD-10-CM | POA: Insufficient documentation

## 2023-09-21 LAB — RESP PANEL BY RT-PCR (RSV, FLU A&B, COVID)  RVPGX2
Influenza A by PCR: NEGATIVE
Influenza B by PCR: NEGATIVE
Resp Syncytial Virus by PCR: NEGATIVE
SARS Coronavirus 2 by RT PCR: NEGATIVE

## 2023-09-21 MED ORDER — AZITHROMYCIN 250 MG PO TABS
250.0000 mg | ORAL_TABLET | Freq: Every day | ORAL | 0 refills | Status: DC
  Filled 2023-09-21: qty 6, 5d supply, fill #0

## 2023-09-21 MED ORDER — BENZONATATE 100 MG PO CAPS
100.0000 mg | ORAL_CAPSULE | Freq: Three times a day (TID) | ORAL | 0 refills | Status: DC | PRN
  Filled 2023-09-21: qty 21, 7d supply, fill #0

## 2023-09-21 MED ORDER — PREDNISONE 20 MG PO TABS
20.0000 mg | ORAL_TABLET | Freq: Every day | ORAL | 0 refills | Status: AC
  Filled 2023-09-21: qty 4, 4d supply, fill #0

## 2023-09-21 NOTE — ED Notes (Signed)
ED Provider at bedside. 

## 2023-09-21 NOTE — ED Notes (Signed)
Patient transported to X-ray via WC. 

## 2023-09-21 NOTE — ED Notes (Signed)
Reviewed discharge instructions and medications with pt. Pt states understanding

## 2023-09-21 NOTE — ED Provider Notes (Signed)
Emergency Department Provider Note   I have reviewed the triage vital signs and the nursing notes.   HISTORY  Chief Complaint Cough   HPI Rita Miranda is a 57 y.o. female past history reviewed below presents to the emergency department with cough, congestion, headache.  She has had increased fatigue.  Symptoms have been ongoing for the past 5 days.  She had more mild symptoms last week and notes that multiple people at her job were sick with cough and congestion.  She suspects one of the may have had pneumonia and was out for almost a week.  She is not having any specific chest pain other than when actively coughing.  No known fevers.  Denies abdominal pain, vomiting, diarrhea.  She has been taking DayQuil/NyQuil but notes that she is hesitant to take any additional cough/cold medications as she is unsure how they will affect her heart rate, which has been an issue with her in the past.    Past Medical History:  Diagnosis Date   Anemia    Bradycardia    Cancer (HCC)    uterine: stage I   Chest pain in adult 11/11/2018   Complication of anesthesia    GERD (gastroesophageal reflux disease)    PONV (postoperative nausea and vomiting)    Sinus bradycardia 03/17/2019    Review of Systems  Constitutional: No fever/chills. Positive fatigue and congestions.  Cardiovascular: Denies chest pain. Respiratory: Denies shortness of breath. Positive cough.  Gastrointestinal: No abdominal pain.  No nausea, no vomiting.   Musculoskeletal: Negative for back pain. Skin: Negative for rash. Neurological: Positive HA.   ____________________________________________   PHYSICAL EXAM:  VITAL SIGNS: ED Triage Vitals [09/21/23 0742]  Encounter Vitals Group     BP 125/78     Pulse Rate 68     Resp 18     Temp (!) 97.4 F (36.3 C)     Temp Source Oral     SpO2 97 %   Constitutional: Alert and oriented. Well appearing and in no acute distress. Eyes: Conjunctivae are normal. Head:  Atraumatic. Nose: No congestion/rhinnorhea. Mouth/Throat: Mucous membranes are moist. Cardiovascular: Normal rate, regular rhythm. Good peripheral circulation. Grossly normal heart sounds.   Respiratory: Normal respiratory effort.  No retractions. Lungs CTAB. Gastrointestinal: No distention.  Musculoskeletal: No gross deformities of extremities. Neurologic:  Normal speech and language.  Skin:  Skin is warm, dry and intact. No rash noted  ____________________________________________   LABS (all labs ordered are listed, but only abnormal results are displayed)  Labs Reviewed  RESP PANEL BY RT-PCR (RSV, FLU A&B, COVID)  [id number]   ____________________________________________  RADIOLOGY  DG Chest 2 View Result Date: 09/21/2023 CLINICAL DATA:  Cough and congestion.  Shortness of breath. EXAM: CHEST - 2 VIEW COMPARISON:  08/11/2022. FINDINGS: Bilateral lung fields are clear. Bilateral costophrenic angles are clear. Normal cardio-mediastinal silhouette. No acute osseous abnormalities. The soft tissues are within normal limits. IMPRESSION: *No active cardiopulmonary disease. Electronically Signed   By: Jules Schick M.D.   On: 09/21/2023 10:18    ____________________________________________   PROCEDURES  Procedure(s) performed:   Procedures  None ____________________________________________   INITIAL IMPRESSION / ASSESSMENT AND PLAN / ED COURSE  Pertinent labs & imaging results that were available during my care of the patient were reviewed by me and considered in my medical decision making (see chart for details).   This patient is Presenting for Evaluation of cough/congestion, which does require a range of treatment options, and is a  complaint that involves a moderate risk of morbidity and mortality.  The Differential Diagnoses includes but is not exclusive to community-acquired pneumonia, urinary tract infection, acute cholecystitis, viral syndrome, cellulitis, tick bourne  disease,  decubitus ulcer, necrotizing fasciitis, meningitis, encephalitis, influenza, etc.  I did obtain Additional Historical Information from husband at bedside.   Clinical Laboratory Tests Ordered, included COVID, flu, RSV PCR negative.  Radiologic Tests Ordered, included CXR. I independently interpreted the images and agree with radiology interpretation.   Cardiac Monitor Tracing which shows NSR.    Social Determinants of Health Risk patient is a non-smoker.   Medical Decision Making: Summary:  Patient presents emergency department with cough and congestion over the past 4 to 5 days.  Arrives afebrile, no tachypnea, no tachycardia, no hypoxemia.  She overall looks very well.  Suspect viral URI versus bronchitis.  Lower suspicion for pneumonia but has had 5+ days of symptoms.  Plan for chest x-ray, viral panel, reassess.  Reevaluation with update and discussion with patient and husband at bedside.  Chest x-ray without pneumonia.  Limited viral panel negative here.  Plan to treat empirically for bronchitis with close PCP follow-up. Discussed strict ED return precautions.   Patient's presentation is most consistent with acute, uncomplicated illness.   Disposition: discharge  ____________________________________________  FINAL CLINICAL IMPRESSION(S) / ED DIAGNOSES  Final diagnoses:  Acute bronchitis, unspecified organism     NEW OUTPATIENT MEDICATIONS STARTED DURING THIS VISIT:  New Prescriptions   AZITHROMYCIN (ZITHROMAX) 250 MG TABLET    Take 1 tablet (250 mg total) by mouth daily. Take first 2 tablets together, then 1 every day until finished.   BENZONATATE (TESSALON) 100 MG CAPSULE    Take 1 capsule (100 mg total) by mouth 3 (three) times daily as needed.   PREDNISONE (DELTASONE) 20 MG TABLET    Take 1 tablet (20 mg total) by mouth daily for 4 days.    Note:  This document was prepared using Dragon voice recognition software and may include unintentional dictation  errors.  Alona Bene, MD, [facility] Emergency Medicine    Erie Sica, Arlyss Repress, MD 09/21/23 1034

## 2023-09-21 NOTE — ED Triage Notes (Signed)
Pt reports cough congestion. Throat hurts  when she coughs. Headache. Symptoms began on monday

## 2023-09-21 NOTE — Discharge Instructions (Signed)
I am treating you for bronchitis. Please take medication as directed and follow closely with your PCP in the coming week. Return to the ED if you develop any chest pain, trouble breathing, or other sudden/worsening symptoms.

## 2023-11-09 ENCOUNTER — Other Ambulatory Visit (HOSPITAL_COMMUNITY): Payer: Self-pay

## 2023-11-28 ENCOUNTER — Encounter: Payer: Self-pay | Admitting: Family

## 2023-12-06 ENCOUNTER — Encounter: Payer: Self-pay | Admitting: Family

## 2023-12-13 ENCOUNTER — Encounter: Payer: Self-pay | Admitting: Medical

## 2023-12-13 ENCOUNTER — Ambulatory Visit: Admitting: Medical

## 2023-12-13 ENCOUNTER — Ambulatory Visit (HOSPITAL_BASED_OUTPATIENT_CLINIC_OR_DEPARTMENT_OTHER)
Admission: RE | Admit: 2023-12-13 | Discharge: 2023-12-13 | Disposition: A | Discharge status: Home / Self Care | Source: Ambulatory Visit | Attending: Medical | Admitting: Medical

## 2023-12-13 VITALS — BP 108/72 | HR 63 | Temp 98.0°F | Resp 18 | Ht 68.0 in | Wt 204.0 lb

## 2023-12-13 DIAGNOSIS — M25552 Pain in left hip: Secondary | ICD-10-CM

## 2023-12-13 DIAGNOSIS — D649 Anemia, unspecified: Secondary | ICD-10-CM

## 2023-12-13 DIAGNOSIS — R112 Nausea with vomiting, unspecified: Secondary | ICD-10-CM

## 2023-12-13 DIAGNOSIS — R739 Hyperglycemia, unspecified: Secondary | ICD-10-CM | POA: Diagnosis not present

## 2023-12-13 LAB — CBC WITH DIFFERENTIAL/PLATELET
Basophils Absolute: 0 10*3/uL (ref 0.0–0.1)
Basophils Relative: 0.5 % (ref 0.0–3.0)
Eosinophils Absolute: 0.1 10*3/uL (ref 0.0–0.7)
Eosinophils Relative: 1.4 % (ref 0.0–5.0)
HCT: 40.7 % (ref 36.0–46.0)
Hemoglobin: 13.3 g/dL (ref 12.0–15.0)
Lymphocytes Relative: 32.7 % (ref 12.0–46.0)
Lymphs Abs: 1.5 10*3/uL (ref 0.7–4.0)
MCHC: 32.7 g/dL (ref 30.0–36.0)
MCV: 97 fl (ref 78.0–100.0)
Monocytes Absolute: 0.5 10*3/uL (ref 0.1–1.0)
Monocytes Relative: 10.9 % (ref 3.0–12.0)
Neutro Abs: 2.6 10*3/uL (ref 1.4–7.7)
Neutrophils Relative %: 54.5 % (ref 43.0–77.0)
Platelets: 260 10*3/uL (ref 150.0–400.0)
RBC: 4.2 Mil/uL (ref 3.87–5.11)
RDW: 13.8 % (ref 11.5–15.5)
WBC: 4.7 10*3/uL (ref 4.0–10.5)

## 2023-12-13 LAB — COMPREHENSIVE METABOLIC PANEL
ALT: 12 U/L (ref 0–35)
AST: 13 U/L (ref 0–37)
Albumin: 4.3 g/dL (ref 3.5–5.2)
Alkaline Phosphatase: 79 U/L (ref 39–117)
BUN: 15 mg/dL (ref 6–23)
CO2: 30 meq/L (ref 19–32)
Calcium: 9.3 mg/dL (ref 8.4–10.5)
Chloride: 103 meq/L (ref 96–112)
Creatinine, Ser: 0.64 mg/dL (ref 0.40–1.20)
GFR: 98.98 mL/min (ref >60.00)
Glucose, Bld: 92 mg/dL (ref 70–99)
Potassium: 4.5 meq/L (ref 3.5–5.1)
Sodium: 138 meq/L (ref 135–145)
Total Bilirubin: 0.4 mg/dL (ref 0.2–1.2)
Total Protein: 6.7 g/dL (ref 6.0–8.3)

## 2023-12-13 LAB — HEMOGLOBIN A1C: Hgb A1c MFr Bld: 5.9 % (ref 4.6–6.5)

## 2023-12-13 LAB — LIPASE: Lipase: 24 U/L (ref 11.0–59.0)

## 2023-12-13 NOTE — Patient Instructions (Signed)
 Left Hip Pain Chronic pain with radiation to knee, unresponsive to previous medications. Differential includes degenerative joint disease and hip bursitis. - Order x-ray of left hip for degenerative changes and joint space assessment. - Consider sports medicine referral for soft tissue evaluation if x-ray is normal. - Evaluate need for stronger NSAID or low-dose tapered steroid based on x-ray results.  Obesity with Elevated Blood Sugar Obesity with BMI 31 and mildly elevated glucose. Evaluating diabetes potential post-gastric sleeve surgery. - Order A1c to assess for diabetes. - Consider GLP-1 injectable if A1c indicates diabetes and insurance approves.(could also rx glp1 under obesity - Refer to [facility] clinic if not diabetic or GLP-1 not covered.  Intermittent Nausea and Vomiting Symptoms likely related to dietary choices and gastric sleeve surgery. - Order metabolic panel, lipase, and CBC for underlying metabolic issues.  Anemia Severe anemia with low iron, previously managed by specialist. Current status unclear. - Recheck CBC and iron panel to assess current anemia status.   Follow up date to be determined after lab and imaging review.

## 2023-12-13 NOTE — Progress Notes (Signed)
 Subjective:    Patient ID: Rita Miranda, female    DOB: Dec 20, 1965, 58 y.o.   MRN: [id number]  HPI  Discussed the use of AI scribe software for clinical note transcription with the patient, who gave verbal consent to proceed.  History of Present Illness   Rita Miranda is a 58 year old female who presents with left hip pain and intermittent nausea and vomiting.  She has been experiencing left hip pain for almost six months. The pain is described as a sensation of pressure on left  hip, predominantly  with radiation into the knee though no knee pain presently. 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 She has tried muscle relaxers, Tylenol, ibuprofen, and Aleve without relief, though hot baths provide some alleviation. No back pain or pain over the sciatic area is reported, and there have been no recent falls.  She reports intermittent nausea and vomiting occurring at least twice a week, associated with certain foods following her gastric sleeve surgery. Despite taking Nexium and famotidine twice daily for reflux, she still experiences occasional vomiting, particularly with specific foods. Her dietary intake is limited, often consuming small meals like kids' meals or a Lunchable, and she sometimes skips breakfast if she drinks tea in the morning.  She has a history of anemia and is currently on iron supplements, recalling treatment for severe anemia in the past. Additionally, she mentions a history of elevated blood sugar levels and struggles with weight loss despite regular gym attendance and walking. Her BMI is currently 6, and she feels 'stuck' at 200 pounds.       On review no fh of thyroid cancer. No hx of pancreatitis per pt.  Review of Systems  Constitutional:  Negative for chills, fatigue and fever.  Respiratory:  Negative for cough, chest tightness, shortness of breath and  wheezing.   Cardiovascular:  Negative for chest pain and palpitations.  Gastrointestinal:  Positive for nausea and vomiting. Negative for abdominal pain, blood in stool and diarrhea.       At times intermittently.  Genitourinary:  Negative for dysuria, flank pain and frequency.  Musculoskeletal:        Hip pain  Neurological:  Negative for dizziness, weakness and numbness.  Hematological:  Negative for adenopathy. Does not bruise/bleed easily.  Psychiatric/Behavioral:  Negative for behavioral problems and decreased concentration.     Past Medical History:  Diagnosis Date   Anemia    Bradycardia    Cancer (HCC)    uterine: stage I   Chest pain in adult 11/11/2018   Complication of anesthesia    GERD (gastroesophageal reflux disease)    PONV (postoperative nausea and vomiting)    Sinus bradycardia 03/17/2019     Social History   Socioeconomic History   Marital status: Married    Spouse name: Not on file   Number of children: Not on file   Years of education: Not on file   Highest education level: Not on file  Occupational History   Not on file  Tobacco Use   Smoking status: Never   Smokeless tobacco: Never  Vaping Use   Vaping status: Never Used  Substance and Sexual Activity   Alcohol use: No   Drug use: No   Sexual activity: Not on file  Other Topics Concern   Not on file  Social History Narrative   Not on file   Social Drivers of  Health   Financial Resource Strain: Not on file  Food Insecurity: Not on file  Transportation Needs: Not on file  Physical Activity: Not on file  Stress: Not on file  Social Connections: Not on file  Intimate Partner Violence: Not on file    Past Surgical History:  Procedure Laterality Date   ABDOMINAL HYSTERECTOMY     CHOLECYSTECTOMY N/A 10/01/2020   Procedure: LAPAROSCOPIC CHOLECYSTECTOMY WITH ATTEMPTED INTRAOPERATIVE CHOLANGIOGRAM AND EXAMINE HIATUS FOR HIATAL HERNIA;  Surgeon: Luretha Murphy, MD;  Location: [facility];  Service:  General;  Laterality: N/A;   EYE SURGERY     HERNIA REPAIR     LAPAROSCOPIC GASTRIC SLEEVE RESECTION WITH HIATAL HERNIA REPAIR      Family History  Problem Relation Age of Onset   Heart disease Mother    AAA (abdominal aortic aneurysm) Mother    Coronary artery disease Brother    Stroke Sister    Colon cancer Neg Hx    Stomach cancer Neg Hx    Esophageal cancer Neg Hx    Pancreatic cancer Neg Hx     Allergies  Allergen Reactions   Other Hives    seafood    Current Outpatient Medications on File Prior to Visit  Medication Sig Dispense Refill   esomeprazole (NEXIUM) 20 MG capsule Take 20 mg by mouth 2 (two) times daily before a meal.     famotidine (PEPCID) 20 MG tablet TAKE 1 TABLET BY MOUTH TWICE DAILY (Patient taking differently: Take 20 mg by mouth 2 (two) times daily.) 60 tablet 0   lidocaine (LIDODERM) 5 % Place 1 patch onto the skin daily. Remove & Discard patch within 12 hours or as directed by MD 30 patch 0   vitamin B-12 (CYANOCOBALAMIN) 500 MCG tablet Take 500 mcg by mouth daily.     No current facility-administered medications on file prior to visit.    BP 108/72   Pulse (!) 55   Temp 98 F (36.7 C)   Resp 18   Ht 5\' 8"  (1.727 m)   Wt 204 lb (92.5 kg)   SpO2 98%   BMI 31.02 kg/m        Objective:   Physical Exam   General Mental Status- Alert. General Appearance- Not in acute distress.   Skin General: Color- Normal Color. Moisture- Normal Moisture.  Neck  No JVD. No thyromegaly.   Chest and Lung Exam Auscultation: Breath Sounds:-CTA  Cardiovascular Auscultation:Rythm- RRR Murmurs & Other Heart Sounds:Auscultation of the heart reveals- No Murmurs.  Abdomen Inspection:-Inspeection Normal. Palpation/Percussion:Note:No mass. Palpation and Percussion of the abdomen reveal- Non Tender, Non Distended + BS, no rebound or guarding.   Neurologic Cranial Nerve exam:- CN III-XII intact(No nystagmus), symmetric smile. Strength:- 5/5 equal and  symmetric strength both upper and lower extremities.    Left hip- pain on rom and on palpation.   Left knee- no pain on rom. No crepius or instability.  Back- no lumbar pain or si area pain on palpation.    Assessment & Plan:   Assessment and Plan    Left Hip Pain Chronic pain with radiation to knee, unresponsive to previous medications. Differential includes degenerative joint disease and hip bursitis. - Order x-ray of left hip for degenerative changes and joint space assessment. - Consider sports medicine referral for soft tissue evaluation if x-ray is normal. - Evaluate need for stronger NSAID or low-dose tapered steroid based on x-ray results.  Obesity with Elevated Blood Sugar Obesity with BMI 31 and mildly elevated  glucose. Evaluating diabetes potential post-gastric sleeve surgery. - Order A1c to assess for diabetes. - Consider GLP-1 injectable if A1c indicates diabetes and insurance approves.(could also rx glp1 under obesity - Refer to [facility] clinic if not diabetic or GLP-1 not covered.  Intermittent Nausea and Vomiting Symptoms likely related to dietary choices and gastric sleeve surgery. - Order metabolic panel, lipase, and CBC for underlying metabolic issues.  Anemia Severe anemia with low iron, previously managed by specialist. Current status unclear. - Recheck CBC and iron panel to assess current anemia status.   Follow up date to be determined after lab and imaging review.        Esperanza Richters, PA-C

## 2023-12-13 NOTE — Addendum Note (Signed)
 Addended by: Gwenevere Abbot on: 12/13/2023 07:48 PM   Modules accepted: Orders

## 2023-12-17 ENCOUNTER — Telehealth: Payer: Self-pay

## 2023-12-17 NOTE — Telephone Encounter (Signed)
 Iron studies pending , will contact pt once all labs are back  Copied from CRM [id number]. Topic: Clinical - Lab/Test Results >> Dec 17, 2023  8:44 AM Rita Miranda wrote: Reason for CRM: Patient is calling in to go over her imaging lab results. Patient states she has not been contacted yet and she would like to go over them in detail.

## 2023-12-19 NOTE — Progress Notes (Unsigned)
    Aleen Sells D.[facility] [address] [place] [address] Phone: [phone]   Assessment and Plan:     There are no diagnoses linked to this encounter.  ***   Pertinent previous records reviewed include ***    Follow Up: ***     Subjective:   I, Ernesto Zukowski, am serving as a Neurosurgeon for Doctor Richardean Sale  Chief Complaint: left hip pain   HPI:   12/20/2023 Patient is a 58 year old female with left hip pain. Patient states  Relevant Historical Information: ***  Additional pertinent review of systems negative.   Current Outpatient Medications:    esomeprazole (NEXIUM) 20 MG capsule, Take 20 mg by mouth 2 (two) times daily before a meal., Disp: , Rfl:    famotidine (PEPCID) 20 MG tablet, TAKE 1 TABLET BY MOUTH TWICE DAILY (Patient taking differently: Take 20 mg by mouth 2 (two) times daily.), Disp: 60 tablet, Rfl: 0   lidocaine (LIDODERM) 5 %, Place 1 patch onto the skin daily. Remove & Discard patch within 12 hours or as directed by MD, Disp: 30 patch, Rfl: 0   vitamin B-12 (CYANOCOBALAMIN) 500 MCG tablet, Take 500 mcg by mouth daily., Disp: , Rfl:    Objective:     There were no vitals filed for this visit.    There is no height or weight on file to calculate BMI.    Physical Exam:    ***   Electronically signed by:  Aleen Sells D.[facility] 4:19 PM 12/19/23

## 2023-12-20 ENCOUNTER — Other Ambulatory Visit (HOSPITAL_BASED_OUTPATIENT_CLINIC_OR_DEPARTMENT_OTHER): Payer: Self-pay

## 2023-12-20 ENCOUNTER — Ambulatory Visit (INDEPENDENT_AMBULATORY_CARE_PROVIDER_SITE_OTHER)

## 2023-12-20 ENCOUNTER — Encounter: Payer: Self-pay | Admitting: Family

## 2023-12-20 ENCOUNTER — Ambulatory Visit (INDEPENDENT_AMBULATORY_CARE_PROVIDER_SITE_OTHER): Admitting: Sports Medicine

## 2023-12-20 VITALS — BP 132/82 | HR 62 | Ht 68.0 in | Wt 204.0 lb

## 2023-12-20 DIAGNOSIS — G8929 Other chronic pain: Secondary | ICD-10-CM | POA: Diagnosis not present

## 2023-12-20 DIAGNOSIS — M545 Low back pain, unspecified: Secondary | ICD-10-CM | POA: Diagnosis not present

## 2023-12-20 DIAGNOSIS — M5442 Lumbago with sciatica, left side: Secondary | ICD-10-CM

## 2023-12-20 MED ORDER — MELOXICAM 15 MG PO TABS
15.0000 mg | ORAL_TABLET | Freq: Every day | ORAL | 0 refills | Status: DC
  Filled 2023-12-20: qty 30, 30d supply, fill #0

## 2023-12-20 NOTE — Patient Instructions (Signed)
-   Start meloxicam 15 mg daily x2 weeks.  If still having pain after 2 weeks, complete 3rd-week of NSAID. May use remaining NSAID as needed once daily for pain control.  Do not to use additional over-the-counter NSAIDs (ibuprofen, naproxen, Advil, Aleve, etc.) while taking prescription NSAIDs.  May use Tylenol [phone] mg 2 to 3 times a day for breakthrough pain. Back and Glute HEP  4 week follow up

## 2023-12-31 ENCOUNTER — Ambulatory Visit: Admitting: Medical

## 2023-12-31 ENCOUNTER — Other Ambulatory Visit (HOSPITAL_BASED_OUTPATIENT_CLINIC_OR_DEPARTMENT_OTHER): Payer: Self-pay

## 2023-12-31 ENCOUNTER — Encounter: Payer: Self-pay | Admitting: Family

## 2023-12-31 VITALS — BP 102/70 | HR 65 | Resp 18 | Ht 68.0 in | Wt 202.4 lb

## 2023-12-31 DIAGNOSIS — D649 Anemia, unspecified: Secondary | ICD-10-CM

## 2023-12-31 DIAGNOSIS — R0683 Snoring: Secondary | ICD-10-CM

## 2023-12-31 DIAGNOSIS — E538 Deficiency of other specified B group vitamins: Secondary | ICD-10-CM | POA: Diagnosis not present

## 2023-12-31 MED ORDER — METFORMIN HCL 500 MG PO TABS
500.0000 mg | ORAL_TABLET | Freq: Two times a day (BID) | ORAL | 0 refills | Status: DC
  Filled 2023-12-31: qty 60, 30d supply, fill #0

## 2023-12-31 NOTE — Addendum Note (Signed)
 Addended by: Gwenevere Abbot on: 12/31/2023 11:10 AM   Modules accepted: Orders

## 2023-12-31 NOTE — Progress Notes (Signed)
 Subjective:    Patient ID: Rita Miranda, female    DOB: 1966-07-22, 58 y.o.   MRN: [id number]  HPI  Pt in for follow up.   She states her msk pain resolved with tx thru sports med MD.  1. Chronic bilateral low back pain with left-sided sciatica -Chronic with exacerbation, initial sports medicine visit - Bilateral low back pain with left-sided radicular symptoms down posterior left hip and hamstring that have been present for years and worsening over the past 4 weeks - Reviewed patient's hip x-ray which was unremarkable.  Will obtain lumbar x-rays at today's visit and review at follow-up - Start meloxicam 15 mg daily x2 weeks.  If still having pain after 2 weeks, complete 3rd-week of NSAID. May use remaining NSAID as needed once daily for pain control.  Do not to use additional over-the-counter NSAIDs (ibuprofen, naproxen, Advil, Aleve, etc.) while taking prescription NSAIDs.  May use Tylenol [id number] mg 2 to 3 times a day for breakthrough pain. - Start HEP for low back and gluteal musculature    Pt is very happy with progress. States pain is resolved.   Rita Miranda is a 58 year old female with prediabetes and sleep apnea who presents for evaluation of elevated blood sugar levels and weight management.  She has an average blood sugar level of 118, which is within the prediabetic range. She has not been on any medication for diabetes previously and is considering options for managing her blood sugar levels and weight.  She weighs 202 pounds, down from 204 pounds since her last visit, and has been maintaining the same weight loss efforts. She recalls weighing 120 pounds before having children and notes significant weight gain over the years/after surgery. Her diet consists of small meals, including 'kids lunchables' and 'kids meals' when dining out, and she continues to follow dietary guidelines post-surgery.  She has a history of sleep apnea diagnosed approximately eight years ago.  She was advised to use a CPAP machine but could not tolerate it due to claustrophobia. She reports occasional snoring, which may have increased with weight gain.  She experiences episodes of bradycardia, with her pulse occasionally dropping to 55 bpm, depending on her activity. She wore a three-day heart monitor in the past, which she found bothersome. She might need a pacemaker if her condition worsens, but currently, no medication is prescribed for her heart rate per cardiologist.  She has a history of gastric sleeve surgery, which initially helped her lose weight and improve her energy levels. She reports occasional fatigue, which she attributes to her job. Her B12 levels were previously found to be low, but she has not pursued B12 injections due to her work schedule. She is currently taking one iron tablet daily after receiving an iron infusion in the past. Hx of anemia.  Review of Systems See hpi    Objective:   Physical Exam  General- No acute distress. Pleasant patient. Neck- Full range of motion, no jvd Lungs- Clear, even and unlabored. Heart- regular rate and rhythm. Neurologic- CNII- XII grossly intact.       Assessment & Plan:   Patient Instructions  Prediabetes and obesity Blood sugar average 118, A1c below 6.5. Discussed GLP-1 injectables if A1c indicates diabetes(with your insurance I have doubts would cover glp1 for weight loss). Metformin to see if can help loose weight. Consider Referral to specialist for GLP-1 therapy exploration if you fail effort with metformin. - Prescribe metformin 500 mg, start one tablet daily for  two weeks, then increase to twice daily if no GI symptoms. - In one month will see if need to refer to weight loss specialist for potential GLP-1 therapy consideration. -Weight decreased to 202 pounds. Discussed impact on snoring and sleep apnea. Encouraged diet and exercise. Referral to specialist to evaluate snoring but declined today/ - Advise low  sugar diet, lean proteins, fruits, and vegetables. - Encourage regular exercise. - Monitor weight loss progress over next month.  Sleep Apnea Diagnosed eight years ago. CPAP intolerance due to claustrophobia. Weight gain may exacerbate symptoms. Discussed sleep study if no weight loss. - Consider referral for sleep study if weight loss not achieved.  Bradycardia Occasional bradycardia with heart rate dropping to 55 bpm. Previous cardiologist evaluation, no pacemaker needed. Wears heart monitor occasionally. No immediate intervention required.  Iron Deficiency Anemia Iron deficiency anemia with current supplementation. Previous iron infusion. Potential over-supplementation noted. - Check CBC and iron panel. - Adjust iron supplementation based on lab results.  Vitamin B12 Deficiency Previously low B12 levels. Declined injections due to work schedule. No current supplementation. - Check B12 levels. - Consider alternative B12 supplementation if levels remain low.  Follow up in one month or sooner if needed   [employer], PA-C

## 2023-12-31 NOTE — Patient Instructions (Signed)
 Prediabetes and obesity Blood sugar average 118, A1c below 6.5. Discussed GLP-1 injectables if A1c indicates diabetes(with your insurance I have doubts would cover glp1 for weight loss). Metformin to see if can help loose weight. Consider Referral to specialist for GLP-1 therapy exploration if you fail effort with metformin. - Prescribe metformin 500 mg, start one tablet daily for two weeks, then increase to twice daily if no GI symptoms. - In one month will see if need to refer to weight loss specialist for potential GLP-1 therapy consideration. -Weight decreased to 202 pounds. Discussed impact on snoring and sleep apnea. Encouraged diet and exercise. Referral to specialist to evaluate snoring but declined today/ - Advise low sugar diet, lean proteins, fruits, and vegetables. - Encourage regular exercise. - Monitor weight loss progress over next month.  Sleep Apnea Diagnosed eight years ago. CPAP intolerance due to claustrophobia. Weight gain may exacerbate symptoms. Discussed sleep study if no weight loss. - Consider referral for sleep study if weight loss not achieved.  Bradycardia Occasional bradycardia with heart rate dropping to 55 bpm. Previous cardiologist evaluation, no pacemaker needed. Wears heart monitor occasionally. No immediate intervention required.  Iron Deficiency Anemia Iron deficiency anemia with current supplementation. Previous iron infusion. Potential over-supplementation noted. - Check CBC and iron panel. - Adjust iron supplementation based on lab results.  Vitamin B12 Deficiency Previously low B12 levels. Declined injections due to work schedule. No current supplementation. - Check B12 levels. - Consider alternative B12 supplementation if levels remain low.  Follow up in one month or sooner if needed

## 2024-01-31 ENCOUNTER — Other Ambulatory Visit (HOSPITAL_BASED_OUTPATIENT_CLINIC_OR_DEPARTMENT_OTHER): Payer: Self-pay

## 2024-01-31 ENCOUNTER — Encounter: Payer: Self-pay | Admitting: Family

## 2024-01-31 ENCOUNTER — Other Ambulatory Visit (HOSPITAL_COMMUNITY): Payer: Self-pay

## 2024-01-31 ENCOUNTER — Ambulatory Visit: Payer: Self-pay | Admitting: *Deleted

## 2024-01-31 ENCOUNTER — Other Ambulatory Visit: Payer: Self-pay

## 2024-01-31 ENCOUNTER — Telehealth: Payer: Self-pay

## 2024-01-31 ENCOUNTER — Ambulatory Visit: Admitting: Medical

## 2024-01-31 VITALS — BP 105/64 | HR 85 | Temp 97.7°F | Resp 18 | Ht 68.0 in | Wt 195.0 lb

## 2024-01-31 DIAGNOSIS — J029 Acute pharyngitis, unspecified: Secondary | ICD-10-CM

## 2024-01-31 LAB — POCT RAPID STREP A (OFFICE): Rapid Strep A Screen: NEGATIVE

## 2024-01-31 MED ORDER — FLUTICASONE PROPIONATE 50 MCG/ACT NA SUSP
2.0000 | Freq: Every day | NASAL | 1 refills | Status: AC
  Filled 2024-01-31: qty 16, 30d supply, fill #0
  Filled 2024-05-09: qty 16, 30d supply, fill #1

## 2024-01-31 MED ORDER — LEVOCETIRIZINE DIHYDROCHLORIDE 5 MG PO TABS
5.0000 mg | ORAL_TABLET | Freq: Every evening | ORAL | 3 refills | Status: AC
  Filled 2024-01-31: qty 30, 30d supply, fill #0
  Filled 2024-05-09: qty 30, 30d supply, fill #1
  Filled 2024-07-23: qty 30, 30d supply, fill #2
  Filled 2024-09-03: qty 30, 30d supply, fill #3

## 2024-01-31 MED ORDER — METFORMIN HCL 500 MG PO TABS
500.0000 mg | ORAL_TABLET | Freq: Two times a day (BID) | ORAL | 3 refills | Status: AC
  Filled 2024-01-31: qty 180, 90d supply, fill #0
  Filled 2024-05-09: qty 180, 90d supply, fill #1
  Filled 2024-07-23: qty 180, 90d supply, fill #2

## 2024-01-31 NOTE — Telephone Encounter (Signed)
 Pharmacy Patient Advocate Encounter   Received notification from Patient Pharmacy that prior authorization for Xyzal is required/requested.   Insurance verification completed.   The patient is insured through [facility] MEDICAID .   Per test claim: The current 30 day co-pay is, $4.00.  No PA needed at this time. This test claim was processed through [facility]- copay amounts may vary at other pharmacies due to pharmacy/plan contracts, or as the patient moves through the different stages of their insurance plan.

## 2024-01-31 NOTE — Progress Notes (Signed)
 Subjective:    Patient ID: Rita Miranda, female    DOB: September 22, 1966, 58 y.o.   MRN: [id number]  HPI Rita Miranda is a 58 year old female who presents with allergy symptoms.  She has been experiencing sneezing, nasal congestion, and a painful, itchy throat for the past few days. She reports slight painful swallowing. Despite taking Claritin, an over-the-counter allergy medication, her symptoms persist, suggesting it may not be strong enough.  She has taken two COVID tests, one two days ago and another this morning, both of which were negative. Nasal discharge is clear when she blows her nose.  She has a history of mild elevated sugars, currently managed with metformin . She notes a weight loss of seven pounds since her last visit. Initially, metformin  caused mild upset stomach for the first two to three days, but she no longer experiences any side effects. She requests refills for metformin  as it has helped her lose weight..  No fevers, chills, sweats, or body aches. She confirms experiencing minimal sore throat and allergy symptoms despite taking Claritin.   Review of Systems  Constitutional:  Negative for chills, fatigue and fever.  HENT:  Positive for congestion, sneezing and sore throat. Negative for ear discharge, postnasal drip, sinus pressure and sinus pain.   Respiratory:  Negative for cough, chest tightness, shortness of breath and wheezing.   Cardiovascular:  Negative for chest pain and palpitations.  Gastrointestinal:  Negative for abdominal pain, blood in stool and constipation.  Genitourinary:  Negative for dyspareunia and dysuria.  Musculoskeletal:  Negative for back pain and joint swelling.  Neurological:  Negative for dizziness, syncope, weakness and numbness.  Hematological:  Negative for adenopathy.  Psychiatric/Behavioral:  Negative for behavioral problems and decreased concentration.     Past Medical History:  Diagnosis Date   Anemia    Bradycardia    Cancer  (HCC)    uterine: stage I   Chest pain in adult 11/11/2018   Complication of anesthesia    GERD (gastroesophageal reflux disease)    PONV (postoperative nausea and vomiting)    Sinus bradycardia 03/17/2019     Social History   Socioeconomic History   Marital status: Married    Spouse name: Not on file   Number of children: Not on file   Years of education: Not on file   Highest education level: Not on file  Occupational History   Not on file  Tobacco Use   Smoking status: Never   Smokeless tobacco: Never  Vaping Use   Vaping status: Never Used  Substance and Sexual Activity   Alcohol use: No   Drug use: No   Sexual activity: Not on file  Other Topics Concern   Not on file  Social History Narrative   Not on file   Social Drivers of Health   Financial Resource Strain: Not on file  Food Insecurity: Not on file  Transportation Needs: Not on file  Physical Activity: Not on file  Stress: Not on file  Social Connections: Not on file  Intimate Partner Violence: Not on file    Past Surgical History:  Procedure Laterality Date   ABDOMINAL HYSTERECTOMY     CHOLECYSTECTOMY N/A 10/01/2020   Procedure: LAPAROSCOPIC CHOLECYSTECTOMY WITH ATTEMPTED INTRAOPERATIVE CHOLANGIOGRAM AND EXAMINE HIATUS FOR HIATAL HERNIA;  Surgeon: Jacolyn Matar, MD;  Location: [facility];  Service: General;  Laterality: N/A;   EYE SURGERY     HERNIA REPAIR     LAPAROSCOPIC GASTRIC SLEEVE RESECTION WITH HIATAL HERNIA REPAIR  Family History  Problem Relation Age of Onset   Heart disease Mother    AAA (abdominal aortic aneurysm) Mother    Coronary artery disease Brother    Stroke Sister    Colon cancer Neg Hx    Stomach cancer Neg Hx    Esophageal cancer Neg Hx    Pancreatic cancer Neg Hx     Allergies  Allergen Reactions   Other Hives    seafood    Current Outpatient Medications on File Prior to Visit  Medication Sig Dispense Refill   esomeprazole (NEXIUM) 20 MG capsule Take 20 mg by  mouth 2 (two) times daily before a meal.     famotidine  (PEPCID ) 20 MG tablet TAKE 1 TABLET BY MOUTH TWICE DAILY (Patient taking differently: Take 20 mg by mouth 2 (two) times daily.) 60 tablet 0   lidocaine  (LIDODERM ) 5 % Place 1 patch onto the skin daily. Remove & Discard patch within 12 hours or as directed by MD 30 patch 0   meloxicam  (MOBIC ) 15 MG tablet Take 1 tablet (15 mg total) by mouth daily. 30 tablet 0   vitamin B-12 (CYANOCOBALAMIN) 500 MCG tablet Take 500 mcg by mouth daily.     No current facility-administered medications on file prior to visit.    BP 105/64   Pulse 85   Temp 97.7 F (36.5 C) (Oral)   Resp 18   Ht 5\' 8"  (1.727 m)   Wt 195 lb (88.5 kg)   SpO2 99%   BMI 29.65 kg/m        Objective:   Physical Exam  General Mental Status- Alert. General Appearance- Not in acute distress.   Skin General: Color- Normal Color. Moisture- Normal Moisture.  Neck  No JVD.  Chest and Lung Exam Auscultation: Breath Sounds:-CTA  Cardiovascular Auscultation:Rythm- Regular. Murmurs & Other Heart Sounds:Auscultation of the heart reveals- No Murmurs.  Abdomen Inspection:-Inspeection Normal. Palpation/Percussion:Note:No mass. Palpation and Percussion of the abdomen reveal- Non Tender, Non Distended + BS, no rebound or guarding.   Neurologic Cranial Nerve exam:- CN III-XII intact(No nystagmus), symmetric smile. Strength:- 5/5 equal and symmetric strength both upper and lower extremities.    Heent- mild boggy turbinates. No sinus pressure. Posterior pharynx mild pinkish red. +pnd     Assessment & Plan:   Patient Instructions  Allergic rhinitis Symptoms consistent with allergic rhinitis. COVID and strep tests negative(symptoms not flu like and exceeding 2 day tx time frame). Claritin ineffective. - Prescribe Xyzal  antihistamine. - Prescribe Flonase  nasal spray. - Follow up in two weeks if symptoms persist or sooner if needed.  Elevated blood sugar and pt has  desire to lose weight Mildly elevated blood sugars managed with metformin . Weight loss noted. - Continue metformin . - Refill metformin  prescription.  Follow up 2-3 weeks or sooner if needed

## 2024-01-31 NOTE — Telephone Encounter (Signed)
 Copied from CRM [id number]. Topic: Clinical - Red Word Triage >> Jan 31, 2024  8:12 AM Zipporah Him wrote: Red Word that prompted transfer to Nurse Triage: Cough severe, sneezing all day, thought it was allergies, all night throat was scratchy possibly swollen and could not swallow and it is sore. Cannot breathe, extremely congested and a bad headache since last night. Reason for Disposition  [1] Sore throat with cough/cold symptoms AND [2] present > 5 days  Answer Assessment - Initial Assessment Questions 1. ONSET: "When did the throat start hurting?" (Hours or days ago)      Sore throat, congestion and coughing.   It started yesterday.  I thought it was allergies.   My nose is running.   As day went on I'm feeling worse.   I have a headache.   I took more allergy medicine and it's not helping.   Covid test was negative.      2. SEVERITY: "How bad is the sore throat?" (Scale 1-10; mild, moderate or severe)   - MILD (1-3):  Doesn't interfere with eating or normal activities.   - MODERATE (4-7): Interferes with eating some solids and normal activities.   - SEVERE (8-10):  Excruciating pain, interferes with most normal activities.   - SEVERE WITH DYSPHAGIA (10): Can't swallow liquids, drooling.     Moderate I'm taking Nyquil   It's not helping.    I ended up with pneumonia last time I had these symptoms. 3. STREP EXPOSURE: "Has there been any exposure to strep within the past week?" If Yes, ask: "What type of contact occurred?"      No  4.  VIRAL SYMPTOMS: "Are there any symptoms of a cold, such as a runny nose, cough, hoarse voice or red eyes?"      Runny nose, sore throat, not coughing up anything.    My mucus is clear from my nose.   5. FEVER: "Do you have a fever?" If Yes, ask: "What is your temperature, how was it measured, and when did it start?"     No 6. PUS ON THE TONSILS: "Is there pus on the tonsils in the back of your throat?"     No 7. OTHER SYMPTOMS: "Do you have any other symptoms?"  (e.g., difficulty breathing, headache, rash)     No chest pain or shortness of breath 8. PREGNANCY: "Is there any chance you are pregnant?" "When was your last menstrual period?"     N/A due to age  Protocols used: Sore Throat-A-AH  Chief Complaint: sore throat, coughing, runny nose, sneezing, headache getting worse Symptoms: above and getting worse Frequency: Started yesterday Pertinent Negatives: Patient denies fever or coughing up anything.    Covid test negative Disposition: [] ED /[] Urgent Care (no appt availability in office) / [x] Appointment(In office/virtual)/ []  [place] [facility]/ [] Home Care/ [] Refused Recommended Disposition /[] [place] [facility]/ []  Follow-up with PCP Additional Notes: Appt made with Edward Saguier, PA-C for this morning at 10:20

## 2024-02-01 NOTE — Patient Instructions (Signed)
 Allergic rhinitis Symptoms consistent with allergic rhinitis. COVID and strep tests negative(symptoms not flu like and exceeding 2 day tx time frame). Claritin ineffective. - Prescribe Xyzal  antihistamine. - Prescribe Flonase  nasal spray. - Follow up in two weeks if symptoms persist or sooner if needed.  Elevated blood sugar and pt has desire to lose weight Mildly elevated blood sugars managed with metformin . Weight loss noted. - Continue metformin . - Refill metformin  prescription.  Follow up 2-3 weeks or sooner if needed

## 2024-02-06 ENCOUNTER — Telehealth: Payer: Self-pay

## 2024-02-06 ENCOUNTER — Encounter: Payer: Self-pay | Admitting: Family

## 2024-02-06 ENCOUNTER — Other Ambulatory Visit (HOSPITAL_COMMUNITY): Payer: Self-pay

## 2024-02-06 NOTE — Telephone Encounter (Signed)
 Pharmacy Patient Advocate Encounter   Received notification from CoverMyMeds that prior authorization for metFORMIN  HCl 500MG  tablets is required/requested.   Insurance verification completed.   The patient is insured through [facility] .   Per test claim: The current 30 day co-pay is, $4.00.  No PA needed at this time. This test claim was processed through [facility]- copay amounts may vary at other pharmacies due to pharmacy/plan contracts, or as the patient moves through the different stages of their insurance plan.

## 2024-02-08 ENCOUNTER — Other Ambulatory Visit (HOSPITAL_COMMUNITY): Payer: Self-pay

## 2024-04-25 ENCOUNTER — Encounter: Payer: Self-pay | Admitting: Student

## 2024-04-25 ENCOUNTER — Ambulatory Visit (INDEPENDENT_AMBULATORY_CARE_PROVIDER_SITE_OTHER): Admitting: Student

## 2024-04-25 ENCOUNTER — Other Ambulatory Visit (HOSPITAL_BASED_OUTPATIENT_CLINIC_OR_DEPARTMENT_OTHER): Payer: Self-pay

## 2024-04-25 VITALS — BP 122/80 | HR 79 | Temp 97.9°F | Resp 18 | Ht 68.0 in | Wt 194.0 lb

## 2024-04-25 DIAGNOSIS — M549 Dorsalgia, unspecified: Secondary | ICD-10-CM | POA: Diagnosis not present

## 2024-04-25 DIAGNOSIS — G8929 Other chronic pain: Secondary | ICD-10-CM

## 2024-04-25 MED ORDER — MELOXICAM 15 MG PO TABS
15.0000 mg | ORAL_TABLET | Freq: Every day | ORAL | 0 refills | Status: DC
  Filled 2024-04-25: qty 14, 14d supply, fill #0

## 2024-04-25 MED ORDER — METHYLPREDNISOLONE 4 MG PO TBPK
ORAL_TABLET | ORAL | 0 refills | Status: AC
  Filled 2024-04-25: qty 21, 6d supply, fill #0

## 2024-04-25 NOTE — Progress Notes (Signed)
   Acute Office Visit  Subjective:     Patient ID: Rita Miranda, female    DOB: May 01, 1966, 58 y.o.   MRN: [id number]  Chief Complaint  Patient presents with   Back Injury    04/20/24 -- radiates to left leg - sharp pain     HPI Patient is in today for acute back pain. PMHx chronic bilateral low back pain with left leg sciatica.  Last seen by sports medicine March 2025.  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 Pain level- 6/10 with movement, worse with movement- describes aching pain, sharp stabbing pain. Pt has Hx of low back pain was recently seen by sports medicine March 2025.  She was given meloxicam  for 2 weeks and reports her pain was very much improved.  Denies fall, denies trauma.  Denies bowel or bladder irregularities.  Patient denies fever, chills, SOB, CP, palpitations, dyspnea, edema, HA, vision changes, N/V/D, abdominal pain, urinary symptoms, rash, weight changes, and recent illness or hospitalizations.     ROS  See HPI    Objective:    BP 122/80   Pulse 79   Temp 97.9 F (36.6 C)   Resp 18   Ht 5' 8 (1.727 m)   Wt 194 lb (88 kg)   SpO2 99%   BMI 29.50 kg/m    Physical Exam  General: No acute distress. Awake and conversant.  Eyes: Normal conjunctiva, anicteric. Round symmetric pupils.  ENT: Hearing grossly intact. No nasal discharge.  Neck: Neck is supple. No masses or thyromegaly.  Respiratory: CTAB. Respirations are non-labored. No wheezing.  Skin: Warm. No rashes or ulcers.  Psych: Alert and oriented. Cooperative, Appropriate mood and affect, Normal judgment.  CV: RRR. No murmur. No lower extremity edema.  MSK: No clubbing or cyanosis.  +painful ambulation, limited ROM back r/t acute pain. Neuro:  CN II-XII grossly normal.    No results found for any visits on 04/25/24.      Assessment & Plan:   Problem List Items Addressed This Visit   None Visit Diagnoses        Acute on chronic back pain    -  Primary   Relevant Medications   methylPREDNISolone  (MEDROL ) 4 MG tablet   meloxicam  (MOBIC ) 15 MG tablet     Acute flare- chronic low back pain Rx- Mobic  and Prednisone  taper Encouraged moist heat and gentle stretching as tolerated. May try NSAIDs and prescription meds as directed and report if symptoms worsen or seek immediate care.   Meds ordered this encounter  Medications   methylPREDNISolone  (MEDROL ) 4 MG tablet    Sig: Use as directed; Follow instructions on package.    Dispense:  21 tablet    Refill:  0    Supervising Provider:   DOMENICA BLACKBIRD A [[id number]]   meloxicam  (MOBIC ) 15 MG tablet    Sig: Take 1 tablet (15 mg total) by mouth daily.    Dispense:  14 tablet    Refill:  0    Supervising Provider:   DOMENICA BLACKBIRD A [[id number]]     No follow-ups on file.  Maryagnes Carrasco L Jaziya Obarr, NP

## 2024-05-02 ENCOUNTER — Other Ambulatory Visit (HOSPITAL_COMMUNITY): Payer: Self-pay

## 2024-05-09 ENCOUNTER — Other Ambulatory Visit (HOSPITAL_BASED_OUTPATIENT_CLINIC_OR_DEPARTMENT_OTHER): Payer: Self-pay

## 2024-05-13 ENCOUNTER — Telehealth: Payer: Self-pay | Admitting: Medical

## 2024-05-13 NOTE — Telephone Encounter (Signed)
 Pt pharmacy sent me note stating she needs to be on statin. Go ahead and schedule early morning appt with me and advise pt to fast past midnight. Will discuss statin and then send pt to lab.

## 2024-05-14 NOTE — Telephone Encounter (Signed)
Pt called - apt scheduled.

## 2024-05-15 ENCOUNTER — Ambulatory Visit: Admitting: Medical

## 2024-05-15 VITALS — BP 118/80 | HR 57 | Temp 98.0°F | Resp 17 | Ht 68.0 in | Wt 191.6 lb

## 2024-05-15 DIAGNOSIS — Z1159 Encounter for screening for other viral diseases: Secondary | ICD-10-CM | POA: Diagnosis not present

## 2024-05-15 DIAGNOSIS — D509 Iron deficiency anemia, unspecified: Secondary | ICD-10-CM

## 2024-05-15 DIAGNOSIS — Z1231 Encounter for screening mammogram for malignant neoplasm of breast: Secondary | ICD-10-CM | POA: Diagnosis not present

## 2024-05-15 DIAGNOSIS — R739 Hyperglycemia, unspecified: Secondary | ICD-10-CM

## 2024-05-15 DIAGNOSIS — E785 Hyperlipidemia, unspecified: Secondary | ICD-10-CM

## 2024-05-15 NOTE — Progress Notes (Signed)
 Subjective:    Patient ID: Rita Miranda, female    DOB: [birth year], 58 y.o.   MRN: [id number]  HPI  Discussed the use of AI scribe software for clinical note transcription with the patient, who gave verbal consent to proceed.  History of Present Illness   Rita Miranda is a 58 year old female who presents for evaluation of her cholesterol levels.  She was informed that her cholesterol levels are high, with a previous measurement of 205 mg/dL for total cholesterol and 125 mg/dL for LDL two years ago. Pt has lost purposefully from over 200 pounds to 191 pounds and adopting a healthier diet.  Recently I  received messages from her insurance suggesting she should be on a statin.  She has a history of prediabetes and has been taking metformin , which she feels has aided in weight loss.  Her average blood sugar levels are around 118-120 mg/dL, keeping her in the prediabetic range. No history of diabetes, stroke, or mini-stroke.  She works as a Advertising copywriter at [employer], which involves significant physical activity, including walking, bending, and lifting. She estimates walking close to 10,000 steps a day.  She had a normal mammogram approximately two to three years ago at her previous job at [employer]. She has not had a recent mammogram since then. She has received hepatitis and TB screenings through her job, as well as three COVID-19 vaccinations. She does not receive the flu vaccine due to past adverse reactions.       Review of Systems  Constitutional:  Negative for chills, fatigue and fever.  HENT:  Negative for congestion.   Respiratory:  Negative for cough, chest tightness and wheezing.   Cardiovascular:  Negative for chest pain and palpitations.  Gastrointestinal:  Negative for abdominal pain, constipation, nausea and vomiting.  Genitourinary:  Negative for dysuria.  Musculoskeletal:  Negative for back pain and myalgias.  Skin:  Negative for rash.  Neurological:   Negative for dizziness, weakness and light-headedness.  Psychiatric/Behavioral:  Negative for behavioral problems and dysphoric mood. The patient is not nervous/anxious.     Past Medical History:  Diagnosis Date   Anemia    Bradycardia    Cancer (HCC)    uterine: stage I   Chest pain in adult 11/11/2018   Complication of anesthesia    GERD (gastroesophageal reflux disease)    PONV (postoperative nausea and vomiting)    Sinus bradycardia 03/17/2019     Social History   Socioeconomic History   Marital status: Married    Spouse name: Not on file   Number of children: Not on file   Years of education: Not on file   Highest education level: Not on file  Occupational History   Not on file  Tobacco Use   Smoking status: Never   Smokeless tobacco: Never  Vaping Use   Vaping status: Never Used  Substance and Sexual Activity   Alcohol use: No   Drug use: No   Sexual activity: Not on file  Other Topics Concern   Not on file  Social History Narrative   Not on file   Social Drivers of Health   Financial Resource Strain: Not on file  Food Insecurity: Not on file  Transportation Needs: Not on file  Physical Activity: Not on file  Stress: Not on file  Social Connections: Not on file  Intimate Partner Violence: Not on file    Past Surgical History:  Procedure Laterality Date   ABDOMINAL  HYSTERECTOMY     CHOLECYSTECTOMY N/A 10/01/2020   Procedure: LAPAROSCOPIC CHOLECYSTECTOMY WITH ATTEMPTED INTRAOPERATIVE CHOLANGIOGRAM AND EXAMINE HIATUS FOR HIATAL HERNIA;  Surgeon: Gladis Cough, MD;  Location: [facility];  Service: General;  Laterality: N/A;   EYE SURGERY     HERNIA REPAIR     LAPAROSCOPIC GASTRIC SLEEVE RESECTION WITH HIATAL HERNIA REPAIR      Family History  Problem Relation Age of Onset   Heart disease Mother    AAA (abdominal aortic aneurysm) Mother    Coronary artery disease Brother    Stroke Sister    Colon cancer Neg Hx    Stomach cancer Neg Hx    Esophageal  cancer Neg Hx    Pancreatic cancer Neg Hx     Allergies  Allergen Reactions   Other Hives    seafood    Current Outpatient Medications on File Prior to Visit  Medication Sig Dispense Refill   esomeprazole (NEXIUM) 20 MG capsule Take 20 mg by mouth 2 (two) times daily before a meal.     famotidine  (PEPCID ) 20 MG tablet TAKE 1 TABLET BY MOUTH TWICE DAILY (Patient taking differently: Take 20 mg by mouth 2 (two) times daily.) 60 tablet 0   fluticasone  (FLONASE ) 50 MCG/ACT nasal spray Place 2 sprays into both nostrils daily. 16 g 1   levocetirizine (XYZAL ) 5 MG tablet Take 1 tablet (5 mg total) by mouth every evening. 30 tablet 3   lidocaine  (LIDODERM ) 5 % Place 1 patch onto the skin daily. Remove & Discard patch within 12 hours or as directed by MD 30 patch 0   meloxicam  (MOBIC ) 15 MG tablet Take 1 tablet (15 mg total) by mouth daily. 14 tablet 0   metFORMIN  (GLUCOPHAGE ) 500 MG tablet Take 1 tablet (500 mg total) by mouth 2 (two) times daily with a meal. 180 tablet 3   methylPREDNISolone  (MEDROL ) 4 MG TBPK tablet Take as directed on package. 21 tablet 0   vitamin B-12 (CYANOCOBALAMIN) 500 MCG tablet Take 500 mcg by mouth daily.     No current facility-administered medications on file prior to visit.    BP 118/80   Pulse (!) 57   Temp 98 F (36.7 C) (Oral)   Resp 17   Ht 5' 8 (1.727 m)   Wt 191 lb 9.6 oz (86.9 kg)   SpO2 95%   BMI 29.13 kg/m        Objective:   Physical Exam  General- No acute distress. Pleasant patient. Neck- Full range of motion, no jvd Lungs- Clear, even and unlabored. Heart- regular rate and rhythm. Neurologic- CNII- XII grossly intact.       Assessment & Plan:   Assessment and Plan    Prediabetes Average blood glucose 118 mg/dL. Metformin  effective for weight loss and glucose control. 20-pound weight reduction achieved. - Continue metformin  therapy. - Continue low sugar diet. - Encourage regular physical  activity.  Hyperlipidemia Cholesterol last checked two years ago: total 205 mg/dL, LDL 874 mg/dL. 10-year cardiovascular risk score 1.7, low risk. Weight loss and diet may have improved cholesterol. Statin therapy contingent on current labs. - Order metabolic panel and lipid panel. - Continue low cholesterol diet. - Discuss statin therapy based on lab results and risk assessment.  General Health Maintenance Mammogram overdue. Hepatitis vaccinations and TB testing up to date. Declines flu vaccine due to past reactions. - Order screening mammogram. - Perform hepatitis C antibody test.   Follow up date to be determined after lab review.

## 2024-05-15 NOTE — Patient Instructions (Signed)
 Prediabetes Average blood glucose 118 mg/dL. Metformin  effective for weight loss and glucose control. 20-pound weight reduction achieved. - Continue metformin  therapy. - Continue low sugar diet. - Encourage regular physical activity.  Hyperlipidemia Cholesterol last checked two years ago: total 205 mg/dL, LDL 874 mg/dL. 10-year cardiovascular risk score 1.7, low risk. Weight loss and diet may have improved cholesterol. Statin therapy contingent on current labs. - Order metabolic panel and lipid panel. - Continue low cholesterol diet. - Discuss statin therapy based on lab results and risk assessment.  General Health Maintenance Mammogram overdue. Hepatitis vaccinations and TB testing up to date. Declines flu vaccine due to past reactions. - Order screening mammogram. - Perform hepatitis C antibody test.  Follow up date to be determined after lab review.

## 2024-05-20 ENCOUNTER — Encounter (HOSPITAL_BASED_OUTPATIENT_CLINIC_OR_DEPARTMENT_OTHER): Payer: Self-pay

## 2024-05-20 ENCOUNTER — Ambulatory Visit (HOSPITAL_BASED_OUTPATIENT_CLINIC_OR_DEPARTMENT_OTHER)
Admission: RE | Admit: 2024-05-20 | Discharge: 2024-05-20 | Disposition: A | Discharge status: Home / Self Care | Source: Ambulatory Visit | Attending: Medical | Admitting: Medical

## 2024-05-20 ENCOUNTER — Other Ambulatory Visit (INDEPENDENT_AMBULATORY_CARE_PROVIDER_SITE_OTHER)

## 2024-05-20 DIAGNOSIS — E785 Hyperlipidemia, unspecified: Secondary | ICD-10-CM

## 2024-05-20 DIAGNOSIS — D509 Iron deficiency anemia, unspecified: Secondary | ICD-10-CM | POA: Diagnosis not present

## 2024-05-20 DIAGNOSIS — Z1159 Encounter for screening for other viral diseases: Secondary | ICD-10-CM

## 2024-05-20 DIAGNOSIS — R739 Hyperglycemia, unspecified: Secondary | ICD-10-CM

## 2024-05-20 DIAGNOSIS — Z1231 Encounter for screening mammogram for malignant neoplasm of breast: Secondary | ICD-10-CM | POA: Insufficient documentation

## 2024-05-20 LAB — COMPREHENSIVE METABOLIC PANEL WITH GFR
ALT: 12 U/L (ref 0–35)
AST: 13 U/L (ref 0–37)
Albumin: 4.4 g/dL (ref 3.5–5.2)
Alkaline Phosphatase: 77 U/L (ref 39–117)
BUN: 16 mg/dL (ref 6–23)
CO2: 31 meq/L (ref 19–32)
Calcium: 9.5 mg/dL (ref 8.4–10.5)
Chloride: 101 meq/L (ref 96–112)
Creatinine, Ser: 0.77 mg/dL (ref 0.40–1.20)
GFR: 86.14 mL/min (ref >60.00)
Glucose, Bld: 88 mg/dL (ref 70–99)
Potassium: 4.6 meq/L (ref 3.5–5.1)
Sodium: 140 meq/L (ref 135–145)
Total Bilirubin: 0.4 mg/dL (ref 0.2–1.2)
Total Protein: 7 g/dL (ref 6.0–8.3)

## 2024-05-20 LAB — LIPID PANEL
Cholesterol: 251 mg/dL — ABNORMAL HIGH (ref 0–200)
HDL: 62.6 mg/dL (ref >39.00)
LDL Cholesterol: 166 mg/dL — ABNORMAL HIGH (ref 0–99)
NonHDL: 188.57
Total CHOL/HDL Ratio: 4
Triglycerides: 112 mg/dL (ref 0.0–149.0)
VLDL: 22.4 mg/dL (ref 0.0–40.0)

## 2024-05-20 LAB — CBC WITH DIFFERENTIAL/PLATELET
Basophils Absolute: 0 K/uL (ref 0.0–0.1)
Basophils Relative: 0.6 % (ref 0.0–3.0)
Eosinophils Absolute: 0.1 K/uL (ref 0.0–0.7)
Eosinophils Relative: 2.1 % (ref 0.0–5.0)
HCT: 41 % (ref 36.0–46.0)
Hemoglobin: 13.6 g/dL (ref 12.0–15.0)
Lymphocytes Relative: 34.2 % (ref 12.0–46.0)
Lymphs Abs: 1.5 K/uL (ref 0.7–4.0)
MCHC: 33 g/dL (ref 30.0–36.0)
MCV: 96.4 fl (ref 78.0–100.0)
Monocytes Absolute: 0.4 K/uL (ref 0.1–1.0)
Monocytes Relative: 9.5 % (ref 3.0–12.0)
Neutro Abs: 2.3 K/uL (ref 1.4–7.7)
Neutrophils Relative %: 53.6 % (ref 43.0–77.0)
Platelets: 273 K/uL (ref 150.0–400.0)
RBC: 4.26 Mil/uL (ref 3.87–5.11)
RDW: 14 % (ref 11.5–15.5)
WBC: 4.4 K/uL (ref 4.0–10.5)

## 2024-05-21 ENCOUNTER — Ambulatory Visit: Payer: Self-pay | Admitting: Medical

## 2024-05-21 ENCOUNTER — Other Ambulatory Visit (HOSPITAL_BASED_OUTPATIENT_CLINIC_OR_DEPARTMENT_OTHER): Payer: Self-pay

## 2024-05-21 LAB — IRON,TIBC AND FERRITIN PANEL
%SAT: 39 % (ref 16–45)
Ferritin: 23 ng/mL (ref 16–232)
Iron: 122 ug/dL (ref 45–160)
TIBC: 312 ug/dL (ref 250–450)

## 2024-05-21 LAB — HEMOGLOBIN A1C
Hgb A1c MFr Bld: 5.7 % — ABNORMAL HIGH (ref <5.7)
Mean Plasma Glucose: 117 mg/dL
eAG (mmol/L): 6.5 mmol/L

## 2024-05-21 LAB — HEPATITIS C ANTIBODY: Hepatitis C Ab: NONREACTIVE

## 2024-05-21 MED ORDER — ATORVASTATIN CALCIUM 10 MG PO TABS
10.0000 mg | ORAL_TABLET | Freq: Every day | ORAL | 3 refills | Status: AC
  Filled 2024-05-21: qty 90, 90d supply, fill #0
  Filled 2024-07-23: qty 30, 30d supply, fill #1
  Filled 2024-09-03: qty 30, 30d supply, fill #2

## 2024-05-21 NOTE — Addendum Note (Signed)
 Addended by: DORINA DALLAS HERO on: 05/21/2024 09:19 AM   Modules accepted: Orders

## 2024-06-24 ENCOUNTER — Ambulatory Visit: Payer: Self-pay

## 2024-06-24 NOTE — Telephone Encounter (Signed)
 Triage note not clear. Is patient going to UC/ED? Disposition states to be seen in 24 hours but appt scheduled for 06/30/24. Error CRM sent.

## 2024-06-24 NOTE — Telephone Encounter (Signed)
 FYI Only or Action Required?: FYI only for provider.  Patient was last seen in primary care on 05/15/2024 by Dorina Loving, PA-C.  Called Nurse Triage reporting Knee Injury and Bleeding/Bruising.  Symptoms began several weeks ago.  Interventions attempted: OTC medications: ibuprofen , naproxen .  Symptoms are: gradually worsening.  Triage Disposition: See Physician Within 24 Hours  Patient/caregiver understands and will follow disposition?: Yes        Copied from CRM #[id number]. Topic: Clinical - Red Word Triage >> Jun 24, 2024 11:47 AM Laymon HERO wrote: Red Word that prompted transfer to Nurse Triage: hurt knee a few weeks ago- was feeling better, then injured again stepping down on a curb. from knee to calf it is black and blue, very painful Reason for Disposition  Large swelling or bruise (> 2 inches or 5 cm)  Answer Assessment - Initial Assessment Questions 1. MECHANISM: How did the injury happen? (e.g., twisting injury, direct blow)      I was moving a hospital bed... I moved the wrong way, and that was it. 2. ONSET: When did the injury happen? (e.g., minutes, hours ago)      A few weeks ago 3. LOCATION: Where is the injury located?      L leg 4. APPEARANCE of INJURY: What does the injury look like?      Black and blue on side of leg/calf 5. SEVERITY: Can you put weight on that leg? Can you walk?      yes 6. SIZE: For cuts, bruises, or swelling, ask: How large is it? (e.g., inches or centimeters;  entire joint)      Extends from calf to ankle 7. PAIN: Is there pain? If Yes, ask: How bad is the pain?   What does it keep you from doing? (Scale 0-10; or none, mild, moderate, severe)     Pain from knee to ankle 8. TETANUS: For any breaks in the skin, ask: When was your last tetanus booster?     *No Answer* 9. OTHER SYMPTOMS: Do you have any other symptoms?  (e.g., pop when knee injured, swelling, locking, buckling)      Knee swelling only noted  when on feet for long periods of time 10. PREGNANCY: Is there any chance you are pregnant? When was your last menstrual period?       N/a  Protocols used: Knee Injury-A-AH

## 2024-06-25 ENCOUNTER — Ambulatory Visit: Admitting: Medical

## 2024-06-30 ENCOUNTER — Ambulatory Visit: Admitting: Medical

## 2024-06-30 ENCOUNTER — Ambulatory Visit (HOSPITAL_BASED_OUTPATIENT_CLINIC_OR_DEPARTMENT_OTHER)
Admission: RE | Admit: 2024-06-30 | Discharge: 2024-06-30 | Disposition: A | Discharge status: Home / Self Care | Source: Ambulatory Visit | Attending: Medical | Admitting: Medical

## 2024-06-30 ENCOUNTER — Ambulatory Visit: Payer: Self-pay | Admitting: Medical

## 2024-06-30 VITALS — BP 128/82 | HR 57 | Temp 97.6°F | Resp 16 | Ht 68.0 in | Wt 195.4 lb

## 2024-06-30 DIAGNOSIS — M25561 Pain in right knee: Secondary | ICD-10-CM

## 2024-06-30 DIAGNOSIS — T148XXA Other injury of unspecified body region, initial encounter: Secondary | ICD-10-CM

## 2024-06-30 DIAGNOSIS — M79609 Pain in unspecified limb: Secondary | ICD-10-CM

## 2024-06-30 NOTE — Progress Notes (Signed)
 Subjective:    Patient ID: Rita Miranda, female    DOB: [birth year], 58 y.o.   MRN: [id number]  HPI  Rita Miranda is a 58 year old female who presents with right knee pain and bruising.  She experiences right knee pain that began after a fall on Saturday, June 28, 2024. She slipped on water and fell ot ground, noting that her knee 'gave out' during the incident. Since the fall, she experiences pain when weight-bearing, particularly when ascending or descending stairs, and when pivoting. The pain starts at the knee and radiates down to the ankle. Swelling worsens with prolonged standing or walking, and stiffness occurs at night.  Approximately one month ago, she twisted her ankle in a parking lot, but the ankle pain has since resolved. However, she noticed a bruise on her medial calf about a week ago, which she does not recall injuring. The bruise was pointed out by her family, and she experiences intermittent pain in the area.  She has been managing the knee pain with ice and has purchased a knee brace, although she finds it uncomfortable. She has not been taking any specific medications for the pain.  Her job is very strenuous, involving work on twenty floors.     Review of Systems  Constitutional:  Negative for chills, fatigue and fever.  Respiratory:  Negative for cough, chest tightness and wheezing.   Cardiovascular:  Negative for chest pain.  Gastrointestinal:  Negative for abdominal pain.  Genitourinary:  Negative for dysuria, flank pain and frequency.  Musculoskeletal:        See hpi.  Neurological:  Negative for dizziness, speech difficulty, weakness and light-headedness.  Hematological:  Negative for adenopathy.  Psychiatric/Behavioral:  Negative for behavioral problems and decreased concentration.     Past Medical History:  Diagnosis Date   Anemia    Bradycardia    Cancer (HCC)    uterine: stage I   Chest pain in adult 11/11/2018   Complication of anesthesia     GERD (gastroesophageal reflux disease)    PONV (postoperative nausea and vomiting)    Sinus bradycardia 03/17/2019     Social History   Socioeconomic History   Marital status: Married    Spouse name: Not on file   Number of children: Not on file   Years of education: Not on file   Highest education level: Not on file  Occupational History   Not on file  Tobacco Use   Smoking status: Never   Smokeless tobacco: Never  Vaping Use   Vaping status: Never Used  Substance and Sexual Activity   Alcohol use: No   Drug use: No   Sexual activity: Not on file  Other Topics Concern   Not on file  Social History Narrative   Not on file   Social Drivers of Health   Financial Resource Strain: Not on file  Food Insecurity: Not on file  Transportation Needs: Not on file  Physical Activity: Not on file  Stress: Not on file  Social Connections: Not on file  Intimate Partner Violence: Not on file    Past Surgical History:  Procedure Laterality Date   ABDOMINAL HYSTERECTOMY     CHOLECYSTECTOMY N/A 10/01/2020   Procedure: LAPAROSCOPIC CHOLECYSTECTOMY WITH ATTEMPTED INTRAOPERATIVE CHOLANGIOGRAM AND EXAMINE HIATUS FOR HIATAL HERNIA;  Surgeon: Gladis Cough, MD;  Location: [facility];  Service: General;  Laterality: N/A;   EYE SURGERY     HERNIA REPAIR     LAPAROSCOPIC GASTRIC SLEEVE RESECTION WITH  HIATAL HERNIA REPAIR      Family History  Problem Relation Age of Onset   Heart disease Mother    AAA (abdominal aortic aneurysm) Mother    Coronary artery disease Brother    Stroke Sister    Colon cancer Neg Hx    Stomach cancer Neg Hx    Esophageal cancer Neg Hx    Pancreatic cancer Neg Hx     Allergies  Allergen Reactions   Other Hives    seafood    Current Outpatient Medications on File Prior to Visit  Medication Sig Dispense Refill   atorvastatin  (LIPITOR) 10 MG tablet Take 1 tablet (10 mg total) by mouth daily. 90 tablet 3   esomeprazole (NEXIUM) 20 MG capsule Take 20 mg by  mouth 2 (two) times daily before a meal.     famotidine  (PEPCID ) 20 MG tablet TAKE 1 TABLET BY MOUTH TWICE DAILY (Patient taking differently: Take 20 mg by mouth 2 (two) times daily.) 60 tablet 0   fluticasone  (FLONASE ) 50 MCG/ACT nasal spray Place 2 sprays into both nostrils daily. 16 g 1   levocetirizine (XYZAL ) 5 MG tablet Take 1 tablet (5 mg total) by mouth every evening. 30 tablet 3   lidocaine  (LIDODERM ) 5 % Place 1 patch onto the skin daily. Remove & Discard patch within 12 hours or as directed by MD 30 patch 0   meloxicam  (MOBIC ) 15 MG tablet Take 1 tablet (15 mg total) by mouth daily. 14 tablet 0   metFORMIN  (GLUCOPHAGE ) 500 MG tablet Take 1 tablet (500 mg total) by mouth 2 (two) times daily with a meal. 180 tablet 3   methylPREDNISolone  (MEDROL ) 4 MG TBPK tablet Take as directed on package. 21 tablet 0   vitamin B-12 (CYANOCOBALAMIN) 500 MCG tablet Take 500 mcg by mouth daily.     No current facility-administered medications on file prior to visit.    BP 128/82   Pulse (!) 57   Temp 97.6 F (36.4 C) (Oral)   Resp 16   Ht 5' 8 (1.727 m)   Wt 195 lb 6.4 oz (88.6 kg)   SpO2 97%   BMI 29.71 kg/m           Objective:   Physical Exam  General- No acute distress. Pleasant patient. Neck- Full range of motion, no jvd Lungs- Clear, even and unlabored. Heart- regular rate and rhythm. Neurologic- CNII- XII grossly intact.  Rt knee- no pain on flexion or extension but on pivoting foot/valgus/varus stress some pain in medial tibial plateau area. Rt ankle- no tenderness to palpation. Rt calf- moderate light bruising  present.     Assessment & Plan:   Patient Instructions  Acute right knee pain and swelling(poplital pain for one month) Possible meniscus injury. Pain exacerbated by activity, tolerable at rest. - Order x-ray of the right knee to assess joint space and potential bone injury. - Order ultrasound of the right lower extremity to rule out DVT and assess for Baker's  cyst. - Recommend over-the-counter knee brace with Velcro for support. - Advise use of ibuprofen  400 mg for pain management, with option to add Tylenol  if needed. - Consider referral to sports medicine if pain persists or imaging is inconclusive.  Right lower leg ecchymosis and calf pain Intermittent calf pain and ecchymosis without clear trauma. Differential includes DVT or Baker's cyst. - Order CBC to assess platelet levels and rule out hematological issues. - Order ultrasound of the right lower extremity to evaluate for DVT and  other potential causes of ecchymosis.  Follow up 10 days or sooner if needed.     Felicitas Sine, PA-C

## 2024-06-30 NOTE — Addendum Note (Signed)
 Addended by: DORINA DALLAS HERO on: 06/30/2024 09:58 PM   Modules accepted: Orders

## 2024-06-30 NOTE — Patient Instructions (Addendum)
 Acute right knee pain and swelling(poplital pain for one month) Possible meniscus injury. Pain exacerbated by activity, tolerable at rest. - Order x-ray of the right knee to assess joint space and potential bone injury. - Order ultrasound of the right lower extremity to rule out DVT and assess for Baker's cyst. - Recommend over-the-counter knee brace with Velcro for support. - Advise use of ibuprofen  400 mg for pain management, with option to add Tylenol  if needed. - Consider referral to sports medicine if pain persists or imaging is inconclusive.  Right lower leg ecchymosis and calf pain Intermittent calf pain and ecchymosis without clear trauma. Differential includes DVT or Baker's cyst. - Order CBC to assess platelet levels and rule out hematological issues. - Order ultrasound of the right lower extremity to evaluate for DVT and other potential causes of ecchymosis.  Follow up 10 days or sooner if needed.

## 2024-07-01 NOTE — Telephone Encounter (Signed)
 Erle Kaye KIDD, RN   07/01/2024  2:54 PM  Reviewed patient appts in chart. The nurse did schedule a next day appt. It was rescheduled by a PAS. Making the Supervisor aware. PAS should have sent the caller back to a nurse to ensure it was safe to wait that long before scheduling.

## 2024-07-02 ENCOUNTER — Other Ambulatory Visit (HOSPITAL_BASED_OUTPATIENT_CLINIC_OR_DEPARTMENT_OTHER): Payer: Self-pay

## 2024-07-02 ENCOUNTER — Ambulatory Visit (INDEPENDENT_AMBULATORY_CARE_PROVIDER_SITE_OTHER): Admitting: Sports Medicine

## 2024-07-02 VITALS — BP 100/60 | HR 62 | Ht 68.0 in | Wt 193.0 lb

## 2024-07-02 DIAGNOSIS — M25461 Effusion, right knee: Secondary | ICD-10-CM

## 2024-07-02 DIAGNOSIS — M25561 Pain in right knee: Secondary | ICD-10-CM

## 2024-07-02 DIAGNOSIS — G8929 Other chronic pain: Secondary | ICD-10-CM

## 2024-07-02 DIAGNOSIS — M549 Dorsalgia, unspecified: Secondary | ICD-10-CM | POA: Diagnosis not present

## 2024-07-02 MED ORDER — MELOXICAM 15 MG PO TABS
15.0000 mg | ORAL_TABLET | Freq: Every day | ORAL | 0 refills | Status: AC
  Filled 2024-07-02: qty 15, 15d supply, fill #0

## 2024-07-02 NOTE — Progress Notes (Signed)
 Ben Ruel Dimmick D.CLEMENTEEN [facility] [address] [place] [address] Phone: [phone]   Assessment and Plan:     1. Acute pain of right knee (Primary) 2. Effusion of right knee -Acute, initial visit - Moderate effusion of right knee after twisting/collapsing episode last week.  Concern for meniscal injury based on HPI, physical exam - Reviewed patient's x-ray which showed no acute fracture or dislocation.  No significant degenerative changes.  Moderate to large effusion.   Lower extremity vascular ultrasound negative for DVT, but showed small Baker's cyst - Discussed that we could further workup for possible large meniscal tear with knee MRI.  Patient would like to trial conservative therapy first - Start meloxicam  15 mg daily x2 weeks.  If still having pain after 2 weeks, complete 3rd-week of NSAID. May use remaining NSAID as needed once daily for pain control.  Do not to use additional over-the-counter NSAIDs (ibuprofen , naproxen , Advil , Aleve , etc.) while taking prescription NSAIDs.  May use Tylenol  [phone] mg 2 to 3 times a day for breakthrough pain. -May use knee brace as needed for support when physically active - Start HEP for knee  15 additional minutes spent for educating Therapeutic Home Exercise Program.  This included exercises focusing on stretching, strengthening, with focus on eccentric aspects.   Long term goals include an improvement in range of motion, strength, endurance as well as avoiding reinjury. Patient's frequency would include in 1-2 times a day, 3-5 times a week for a duration of 6-12 weeks. Proper technique shown and discussed handout in great detail with ATC.  All questions were discussed and answered.    3. Acute on chronic back pain -Chronic with exacerbation, subsequent visit - Recurrent low back pain likely caused from musculoskeletal dysfunction with patient altering gait with ongoing right knee pain - Start meloxicam  15 mg  daily x2 weeks.  If still having pain after 2 weeks, complete 3rd-week of NSAID. May use remaining NSAID as needed once daily for pain control.  Do not to use additional over-the-counter NSAIDs (ibuprofen , naproxen , Advil , Aleve , etc.) while taking prescription NSAIDs.  May use Tylenol  [phone] mg 2 to 3 times a day for breakthrough pain.     Pertinent previous records reviewed include right knee x-ray 06/30/24, lower extremity vascular ultrasound 10/6-25   Follow Up: 6 weeks for reevaluation.  If no improvement or worsening of symptoms, could consider MRI.  Patient would like to avoid injections, surgery if possible   Subjective:   I, Claretha Schimke am a scribe for Dr. Leonce.   Chief Complaint:   HPI:   12/20/2023 Patient is a 58 year old female with left hip pain. Patient states she has had pain for about 4 year and the pain has gotten worse over the past couple of weeks. Pain is constant. She has shooting pain when lying down. Isnt able to lay on the left side. Tylenol  and ibu for the pain and doesn't help. Feels like someone is pressing down on both hips when laying down. Intermittent numbness and tingling. Pain radiates down to the knee and to the other hip. She has a burning  sensation when sitting. Muscle relaxer's do not help. 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    07/02/24 Patient states right knee pain. It has been a month since the knee started hurting. The knee gave out and she fell. Currently wearing brace that was recommended by her doctor. 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 Injury happened while she was walking and stepping off the curb and she fell. When the pain starts it is starting in front of the knee then going down the leg. Can't turn the leg sideways. Pain doesn't wake her up at night. Knee aches at night. Pain is not so bad that she needs medication. Uses ice and tylenol  or ibruprofen when needed.   Relevant Historical Information:  Bradycardia   Additional pertinent review of systems negative.   Current Outpatient Medications:    atorvastatin  (LIPITOR) 10 MG tablet, Take 1 tablet (10 mg total) by mouth daily., Disp: 90 tablet, Rfl: 3   esomeprazole (NEXIUM) 20 MG capsule, Take 20 mg by mouth 2 (two) times daily before a meal., Disp: , Rfl:    famotidine  (PEPCID ) 20 MG tablet, TAKE 1 TABLET BY MOUTH TWICE DAILY (Patient taking differently: Take 20 mg by mouth 2 (two) times daily.), Disp: 60 tablet, Rfl: 0   fluticasone  (FLONASE ) 50 MCG/ACT nasal spray, Place 2 sprays into both nostrils daily., Disp: 16 g, Rfl: 1   levocetirizine (XYZAL ) 5 MG tablet, Take 1 tablet (5 mg total) by mouth every evening., Disp: 30 tablet, Rfl: 3   lidocaine  (LIDODERM ) 5 %, Place 1 patch onto the skin daily. Remove & Discard patch within 12 hours or as directed by MD, Disp: 30 patch, Rfl: 0   metFORMIN  (GLUCOPHAGE ) 500 MG tablet, Take 1 tablet (500 mg total) by mouth 2 (two) times daily with a meal., Disp: 180 tablet, Rfl: 3   methylPREDNISolone  (MEDROL ) 4 MG TBPK tablet, Take as directed on package., Disp: 21 tablet, Rfl: 0   vitamin B-12 (CYANOCOBALAMIN) 500 MCG tablet, Take 500 mcg by mouth daily., Disp: , Rfl:    meloxicam  (MOBIC ) 15 MG tablet, Take 1 tablet (15 mg total) by mouth daily., Disp: 15 tablet, Rfl: 0   Objective:     Vitals:   07/02/24 0954  BP: 100/60  Pulse: 62  SpO2: 97%  Weight: 193 lb (87.5 kg)  Height: 5' 8 (1.727 m)      Body mass index is 29.35 kg/m.    Physical Exam:    General:  awake, alert oriented, no acute distress nontoxic Skin: no suspicious lesions or rashes Neuro:sensation intact and strength 5/5 with no deficits, no atrophy, normal muscle tone Psych: No signs of anxiety, depression or other mood disorder  Right knee: Moderate swelling No deformity Positive fluid wave, joint milking ROM Flex 95, Ext 5 TTP medial femoral condyle, lateral femoral condyle, medial joint line, lateral joint  line, posterior fossa NTTP over the quad tendon,   patella, plica, patella tendon, tibial tuberostiy, fibular head,  pes anserine bursa, gerdy's tubercle  Neg anterior and posterior drawer Neg lachman Neg sag sign Negative varus stress Negative valgus stress Negative McMurray for palpable pop, though reproduced pain Positive Thessaly  Gait antalgic, favoring left leg   Electronically signed by:  Odis Mace D.[facility]MENT[facility] 10:40 AM 07/02/24

## 2024-07-02 NOTE — Patient Instructions (Signed)
-   Start meloxicam  15 mg daily x2 weeks.  If still having pain after 2 weeks, complete 3rd-week of NSAID. May use remaining NSAID as needed once daily for pain control.  Do not to use additional over-the-counter NSAIDs (ibuprofen , naproxen , Advil , Aleve , etc.) while taking prescription NSAIDs.  May use Tylenol  [phone] mg 2 to 3 times a day for breakthrough pain.  Knee Hep. Use the brace when active. Follow up in 6 weeks.

## 2024-07-23 ENCOUNTER — Encounter: Payer: Self-pay | Admitting: Family

## 2024-07-23 ENCOUNTER — Other Ambulatory Visit (HOSPITAL_BASED_OUTPATIENT_CLINIC_OR_DEPARTMENT_OTHER): Payer: Self-pay

## 2024-09-03 ENCOUNTER — Other Ambulatory Visit (HOSPITAL_BASED_OUTPATIENT_CLINIC_OR_DEPARTMENT_OTHER): Payer: Self-pay

## 2024-09-14 ENCOUNTER — Emergency Department (HOSPITAL_COMMUNITY)
Admission: EM | Admit: 2024-09-14 | Discharge: 2024-09-14 | Disposition: A | Discharge status: Home / Self Care | Attending: Emergency Medicine | Admitting: Emergency Medicine

## 2024-09-14 ENCOUNTER — Encounter: Payer: Self-pay | Admitting: Family

## 2024-09-14 ENCOUNTER — Encounter (HOSPITAL_COMMUNITY): Payer: Self-pay

## 2024-09-14 ENCOUNTER — Emergency Department (HOSPITAL_COMMUNITY)

## 2024-09-14 ENCOUNTER — Other Ambulatory Visit: Payer: Self-pay

## 2024-09-14 DIAGNOSIS — M25522 Pain in left elbow: Secondary | ICD-10-CM | POA: Insufficient documentation

## 2024-09-14 DIAGNOSIS — Y92410 Unspecified street and highway as the place of occurrence of the external cause: Secondary | ICD-10-CM | POA: Insufficient documentation

## 2024-09-14 DIAGNOSIS — M7918 Myalgia, other site: Secondary | ICD-10-CM

## 2024-09-14 DIAGNOSIS — M79602 Pain in left arm: Secondary | ICD-10-CM | POA: Insufficient documentation

## 2024-09-14 MED ORDER — LIDOCAINE 5 % EX PTCH
1.0000 | MEDICATED_PATCH | CUTANEOUS | Status: DC
  Administered 2024-09-14: 1 via TRANSDERMAL
  Filled 2024-09-14: qty 1

## 2024-09-14 MED ORDER — METAXALONE 800 MG PO TABS
800.0000 mg | ORAL_TABLET | Freq: Three times a day (TID) | ORAL | 0 refills | Status: AC
  Filled 2024-09-14: qty 21, 7d supply, fill #0

## 2024-09-14 MED ORDER — LIDOCAINE 5 % EX PTCH
1.0000 | MEDICATED_PATCH | CUTANEOUS | 0 refills | Status: AC
  Filled 2024-09-14: qty 30, 30d supply, fill #0

## 2024-09-14 MED ORDER — HYDROCODONE-ACETAMINOPHEN 5-325 MG PO TABS
1.0000 | ORAL_TABLET | Freq: Once | ORAL | Status: AC
  Administered 2024-09-14: 1 via ORAL
  Filled 2024-09-14: qty 1
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
## 2024-09-15 ENCOUNTER — Other Ambulatory Visit: Payer: Self-pay

## 2024-09-15 ENCOUNTER — Encounter: Payer: Self-pay | Admitting: Family

## 2024-09-15 ENCOUNTER — Other Ambulatory Visit (HOSPITAL_BASED_OUTPATIENT_CLINIC_OR_DEPARTMENT_OTHER): Payer: Self-pay
